# Patient Record
Sex: Female | Born: 1995
Health system: Southern US, Community
[De-identification: ages and names within clinical notes are randomized; demographics above are authoritative.]

## PROBLEM LIST (undated history)

## (undated) ENCOUNTER — Inpatient Hospital Stay (HOSPITAL_COMMUNITY): Payer: Self-pay

## (undated) DIAGNOSIS — F329 Major depressive disorder, single episode, unspecified: Secondary | ICD-10-CM

## (undated) DIAGNOSIS — O210 Mild hyperemesis gravidarum: Secondary | ICD-10-CM

## (undated) DIAGNOSIS — F32A Depression, unspecified: Secondary | ICD-10-CM

## (undated) HISTORY — PX: OTHER SURGICAL HISTORY: SHX169

---
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
## 2017-03-14 ENCOUNTER — Encounter (HOSPITAL_COMMUNITY): Payer: Self-pay | Admitting: *Deleted

## 2017-03-14 ENCOUNTER — Inpatient Hospital Stay (HOSPITAL_COMMUNITY)
Admission: AD | Admit: 2017-03-14 | Discharge: 2017-03-14 | Disposition: A | Discharge status: Home / Self Care | Payer: BLUE CROSS/BLUE SHIELD | Source: Ambulatory Visit | Attending: Obstetrics and Gynecology | Admitting: Obstetrics and Gynecology

## 2017-03-14 DIAGNOSIS — O210 Mild hyperemesis gravidarum: Secondary | ICD-10-CM | POA: Diagnosis present

## 2017-03-14 DIAGNOSIS — R109 Unspecified abdominal pain: Secondary | ICD-10-CM | POA: Insufficient documentation

## 2017-03-14 DIAGNOSIS — Z3A10 10 weeks gestation of pregnancy: Secondary | ICD-10-CM | POA: Insufficient documentation

## 2017-03-14 DIAGNOSIS — O26891 Other specified pregnancy related conditions, first trimester: Secondary | ICD-10-CM | POA: Diagnosis not present

## 2017-03-14 DIAGNOSIS — Z880 Allergy status to penicillin: Secondary | ICD-10-CM | POA: Insufficient documentation

## 2017-03-14 HISTORY — DX: Mild hyperemesis gravidarum: O21.0

## 2017-03-14 LAB — URINALYSIS, ROUTINE W REFLEX MICROSCOPIC
BILIRUBIN URINE: NEGATIVE
Glucose, UA: NEGATIVE mg/dL
HGB URINE DIPSTICK: NEGATIVE
KETONES UR: 20 mg/dL — AB
Leukocytes, UA: NEGATIVE
NITRITE: NEGATIVE
PROTEIN: NEGATIVE mg/dL
SPECIFIC GRAVITY, URINE: 1.019 (ref 1.005–1.030)
pH: 5 (ref 5.0–8.0)

## 2017-03-14 MED ORDER — METOCLOPRAMIDE HCL 10 MG PO TABS: 10.0000 mg | ORAL_TABLET | Freq: Four times a day (QID) | ORAL | 0 refills | Status: DC

## 2017-03-14 MED ORDER — PROMETHAZINE HCL 25 MG/ML IJ SOLN
25.0000 mg | Freq: Once | INTRAVENOUS | Status: AC
  Administered 2017-03-14: 25 mg via INTRAVENOUS
  Filled 2017-03-14: qty 1

## 2017-03-14 MED ORDER — M.V.I. ADULT IV INJ
Freq: Once | INTRAVENOUS | Status: AC
  Administered 2017-03-14: 18:00:00 via INTRAVENOUS
  Filled 2017-03-14: qty 1000

## 2017-03-14 NOTE — MAU Note (Signed)
For the past several days, last 2 days have been worse.  Hasn't been keeping anything down.  Last night ribs were hurting to the point it hurt to breath.  Called office today, told to come here at earliest convenience.  Was prescribed phenergan, not taking it

## 2017-03-14 NOTE — MAU Note (Signed)
PO ICE 

## 2017-03-14 NOTE — MAU Note (Signed)
Report given to Debra, RN.

## 2017-03-14 NOTE — MAU Provider Note (Signed)
History     CSN: [id number]  Arrival date and time: 03/14/17 1412   First Provider Initiated Contact with Patient 03/14/17 1625      Chief Complaint  Patient presents with  . Abdominal Pain  . Emesis   HPI  Christine Osborn is a 21 yo G1P0 at 10.[redacted] wks gestation by LMP presenting to MAU with complaints of vomiting and "not being able to keep anything down for the past several days; with the last 2 days being worse".  She reports that she has vomited so much that her ribs started hurting last night to the point where she could not breathe.  She called the OB office today and was told by a RN to come in for evaluation, because "she probably has put the baby in distress by waiting so long to be seen". She is very concerned that there could be something wrong with her baby.  She has a Rx for Phenergan at home, but hasn't taken it.  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  Past Medical History:  Diagnosis Date  . Morning sickness 03/14/2017    History reviewed. No pertinent surgical history.  History reviewed. No pertinent family history.  Social History  Substance Use Topics  . Smoking status: Never Smoker  . Smokeless tobacco: Never Used  . Alcohol use No    Allergies:  Allergies  Allergen Reactions  . Penicillins Hives    Has patient had a PCN reaction causing immediate rash, facial/tongue/throat swelling, SOB or lightheadedness with hypotension: no Has patient had a PCN reaction causing severe rash involving mucus membranes or skin necrosis: no Has patient had a PCN reaction that required hospitalization: unknown Has patient had a PCN reaction occurring within the last 10 years: no If all of the above answers are "NO", then may proceed with Cephalosporin use.     No prescriptions prior to admission.    Review of Systems  Constitutional: Positive for appetite change.  HENT: Negative.   Eyes: Negative.   Respiratory: Negative.    Cardiovascular: Negative.   Gastrointestinal: Positive for nausea and vomiting.  Endocrine: Negative.   Genitourinary: Negative.   Musculoskeletal: Negative.   Skin: Negative.   Allergic/Immunologic: Negative.   Neurological: Negative.   Hematological: Negative.   Psychiatric/Behavioral: The patient is nervous/anxious ("worried baby is in distress").    Physical Exam   Blood pressure 117/66, pulse 78, temperature 98.4 F (36.9 C), temperature source Oral, resp. rate 16, weight 64.9 kg (143 lb), last menstrual period 12/29/2016, SpO2 100 %.  Physical Exam  Constitutional: She is oriented to person, place, and time. She appears well-developed and well-nourished.  HENT:  Head: Normocephalic.  Eyes: Pupils are equal, round, and reactive to light.  Neck: Normal range of motion.  Cardiovascular: Normal rate, regular rhythm and normal heart sounds.   Respiratory: Effort normal and breath sounds normal.  GI: Soft. Bowel sounds are decreased.  Genitourinary:  Genitourinary Comments: Pelvic deferred / informal BS U/S performed by me -- active fetus with (+) cardiac activity // mvmt and cardiac activity seen by both patient and mother-in-law  Musculoskeletal: Normal range of motion.  Neurological: She is alert and oriented to person, place, and time.  Skin: Skin is warm and dry.  Psychiatric: Her behavior is normal. Judgment and thought content normal. Her mood appears anxious.  Pt was very terful and anxious over the well-being pf her baby; mood is very happy and anxiety alleviated after  informal BS U/S done    MAU Course  Procedures  MDM CCUA IVFs: Liter #1 Phenergan 25 mg in of D5LR at 999 ml/hr Liter #2 MVI 1 ampule in 1000 ml of LR at 500 ml/hr  Results for orders placed or performed during the hospital encounter of 03/14/17 (from the past 24 hour(s))  Urinalysis, Routine w reflex microscopic     Status: Abnormal   Collection Time: 03/14/17  3:06 PM  Result Value Ref  Range   Color, Urine YELLOW YELLOW   APPearance CLOUDY (A) CLEAR   Specific Gravity, Urine 1.019 1.005 - 1.030   pH 5.0 5.0 - 8.0   Glucose, UA NEGATIVE NEGATIVE mg/dL   Hgb urine dipstick NEGATIVE NEGATIVE   Bilirubin Urine NEGATIVE NEGATIVE   Ketones, ur 20 (A) NEGATIVE mg/dL   Protein, ur NEGATIVE NEGATIVE mg/dL   Nitrite NEGATIVE NEGATIVE   Leukocytes, UA NEGATIVE NEGATIVE    Assessment and Plan  Morning sickness - Rx for Reglan 10 mg every 6 hr prn N/V - Advised to use Phenergan 25 mg tablet prn when Reglan not working - Advised to eat small, frequent meals every 2-3 hrs and stay well-hydrated - Keep scheduled appts with [facility]  Discharge home Patient verbalized an understanding of the plan of care and agrees.   Raelyn Mora, MSN, CNM 03/15/2017, 4:38 PM

## 2017-03-14 NOTE — MAU Note (Signed)
PO [facility] 

## 2017-03-23 LAB — OB RESULTS CONSOLE HEPATITIS B SURFACE ANTIGEN: Hepatitis B Surface Ag: NEGATIVE

## 2017-03-23 LAB — OB RESULTS CONSOLE RPR: RPR: NONREACTIVE

## 2017-03-23 LAB — OB RESULTS CONSOLE ABO/RH: RH TYPE: NEGATIVE

## 2017-03-23 LAB — OB RESULTS CONSOLE HIV ANTIBODY (ROUTINE TESTING): HIV: NONREACTIVE

## 2017-03-23 LAB — OB RESULTS CONSOLE ANTIBODY SCREEN: Antibody Screen: NEGATIVE

## 2017-03-23 LAB — OB RESULTS CONSOLE RUBELLA ANTIBODY, IGM: Rubella: NON-IMMUNE/NOT IMMUNE

## 2017-05-17 LAB — OB RESULTS CONSOLE GC/CHLAMYDIA
Chlamydia: NEGATIVE
Gonorrhea: NEGATIVE

## 2017-06-21 ENCOUNTER — Encounter (HOSPITAL_COMMUNITY): Payer: Self-pay | Admitting: *Deleted

## 2017-06-21 ENCOUNTER — Inpatient Hospital Stay (HOSPITAL_COMMUNITY)
Admission: AD | Admit: 2017-06-21 | Discharge: 2017-06-21 | Disposition: A | Discharge status: Home / Self Care | Payer: BLUE CROSS/BLUE SHIELD | Source: Ambulatory Visit | Attending: Obstetrics and Gynecology | Admitting: Obstetrics and Gynecology

## 2017-06-21 DIAGNOSIS — R000 Tachycardia, unspecified: Secondary | ICD-10-CM | POA: Diagnosis not present

## 2017-06-21 DIAGNOSIS — W19XXXA Unspecified fall, initial encounter: Secondary | ICD-10-CM

## 2017-06-21 DIAGNOSIS — Z3A24 24 weeks gestation of pregnancy: Secondary | ICD-10-CM | POA: Insufficient documentation

## 2017-06-21 DIAGNOSIS — O26892 Other specified pregnancy related conditions, second trimester: Secondary | ICD-10-CM | POA: Insufficient documentation

## 2017-06-21 DIAGNOSIS — W000XXA Fall on same level due to ice and snow, initial encounter: Secondary | ICD-10-CM | POA: Diagnosis not present

## 2017-06-21 DIAGNOSIS — Z880 Allergy status to penicillin: Secondary | ICD-10-CM | POA: Diagnosis not present

## 2017-06-21 DIAGNOSIS — R109 Unspecified abdominal pain: Secondary | ICD-10-CM | POA: Diagnosis not present

## 2017-06-21 DIAGNOSIS — O210 Mild hyperemesis gravidarum: Secondary | ICD-10-CM | POA: Diagnosis not present

## 2017-06-21 DIAGNOSIS — R509 Fever, unspecified: Secondary | ICD-10-CM | POA: Insufficient documentation

## 2017-06-21 DIAGNOSIS — O368120 Decreased fetal movements, second trimester, not applicable or unspecified: Secondary | ICD-10-CM | POA: Diagnosis not present

## 2017-06-21 DIAGNOSIS — R1084 Generalized abdominal pain: Secondary | ICD-10-CM | POA: Insufficient documentation

## 2017-06-21 LAB — URINALYSIS, ROUTINE W REFLEX MICROSCOPIC
BILIRUBIN URINE: NEGATIVE
GLUCOSE, UA: NEGATIVE mg/dL
Hgb urine dipstick: NEGATIVE
KETONES UR: NEGATIVE mg/dL
Leukocytes, UA: NEGATIVE
Nitrite: NEGATIVE
PH: 5 (ref 5.0–8.0)
Protein, ur: NEGATIVE mg/dL
Specific Gravity, Urine: 1.013 (ref 1.005–1.030)

## 2017-06-21 LAB — CBC WITH DIFFERENTIAL/PLATELET
BASOS PCT: 0 %
Basophils Absolute: 0 10*3/uL (ref 0.0–0.1)
EOS ABS: 0 10*3/uL (ref 0.0–0.7)
EOS PCT: 0 %
HCT: 31.8 % — ABNORMAL LOW (ref 36.0–46.0)
Hemoglobin: 10.9 g/dL — ABNORMAL LOW (ref 12.0–15.0)
Lymphocytes Relative: 14 %
Lymphs Abs: 1.4 10*3/uL (ref 0.7–4.0)
MCH: 32.2 pg (ref 26.0–34.0)
MCHC: 34.3 g/dL (ref 30.0–36.0)
MCV: 93.8 fL (ref 78.0–100.0)
MONO ABS: 0.4 10*3/uL (ref 0.1–1.0)
MONOS PCT: 4 %
Neutro Abs: 8.1 10*3/uL (ref 1.7–7.7)
Neutrophils Relative %: 82 %
Platelets: 158 10*3/uL (ref 150–400)
RBC: 3.39 MIL/uL — ABNORMAL LOW (ref 3.87–5.11)
RDW: 12.3 % (ref 11.5–15.5)
WBC: 9.9 10*3/uL (ref 4.0–10.5)

## 2017-06-21 MED ORDER — IBUPROFEN 600 MG PO TABS
600.0000 mg | ORAL_TABLET | Freq: Once | ORAL | Status: AC
  Administered 2017-06-21: 600 mg via ORAL
  Filled 2017-06-21: qty 1

## 2017-06-21 NOTE — MAU Note (Signed)
Pt states she feels her belly tightening sometimes at the top and some lower down started around 0030 last night.  States they have been getting closer together.  Some occasional sharp pains on left side down low and at the top of her abdomen.  Denies LOF/VB.

## 2017-06-21 NOTE — MAU Provider Note (Signed)
History     CSN: [id number]  Arrival date and time: 06/21/17 1506   First Provider Initiated Contact with Patient 06/21/17 1635      Chief Complaint  Patient presents with  . Contractions   HPI   Ms.Christine Osborn is a 21 y.o. female G1P0 @ 5113w6d who presents with generalized abdominal pain. States the pain started last night around midnight; the pain woke her up. The pain comes and goes. No bleeding or leaking of water. States she has never had this pain before. States she fell yesterday while going into work. She slipped on Ice and fell back. States she did not fall back directly onto her butt, and did not hit her abdomen. She did not hit her head or lose consciousness.  + fetal movement currently, however had decreased fetal movement today.   OB History    Gravida Para Term Preterm AB Living   1             SAB TAB Ectopic Multiple Live Births                  Past Medical History:  Diagnosis Date  . Morning sickness 03/14/2017    History reviewed. No pertinent surgical history.  History reviewed. No pertinent family history.  Social History   Tobacco Use  . Smoking status: Never Smoker  . Smokeless tobacco: Never Used  Substance Use Topics  . Alcohol use: No  . Drug use: No    Allergies:  Allergies  Allergen Reactions  . Penicillins Hives    Has patient had a PCN reaction causing immediate rash, facial/tongue/throat swelling, SOB or lightheadedness with hypotension: no Has patient had a PCN reaction causing severe rash involving mucus membranes or skin necrosis: no Has patient had a PCN reaction that required hospitalization: unknown Has patient had a PCN reaction occurring within the last 10 years: no If all of the above answers are "NO", then may proceed with Cephalosporin use.     Medications Prior to Admission  Medication Sig Dispense Refill Last Dose  . metoCLOPramide (REGLAN) 10 MG tablet Take 1 tablet (10 mg total) by mouth every 6 (six) hours. 30  tablet 0   . promethazine (PHENERGAN) 25 MG tablet Take 25 mg by mouth every 6 (six) hours as needed for nausea.   1 Not Taking at Unknown time   Results for orders placed or performed during the hospital encounter of 06/21/17 (from the past 48 hour(s))  CBC with Differential     Status: Abnormal   Collection Time: 06/21/17  4:59 PM  Result Value Ref Range   WBC 9.9 4.0 - 10.5 K/uL    Comment: REPEATED TO VERIFY   RBC 3.39 (L) 3.87 - 5.11 MIL/uL   Hemoglobin 10.9 (L) 12.0 - 15.0 g/dL   HCT 29.531.8 (L) 28.436.0 - 13.246.0 %   MCV 93.8 78.0 - 100.0 fL   MCH 32.2 26.0 - 34.0 pg   MCHC 34.3 30.0 - 36.0 g/dL   RDW 44.012.3 10.211.5 - 72.515.5 %   Platelets 158 150 - 400 K/uL    Comment: REPEATED TO VERIFY SPECIMEN CHECKED FOR CLOTS PLATELET COUNT CONFIRMED BY SMEAR    Neutrophils Relative % 82 %   Neutro Abs 8.1 1.7 - 7.7 K/uL   Lymphocytes Relative 14 %   Lymphs Abs 1.4 0.7 - 4.0 K/uL   Monocytes Relative 4 %   Monocytes Absolute 0.4 0.1 - 1.0 K/uL   Eosinophils Relative 0 %  Eosinophils Absolute 0.0 0.0 - 0.7 K/uL   Basophils Relative 0 %   Basophils Absolute 0.0 0.0 - 0.1 K/uL  Urinalysis, Routine w reflex microscopic     Status: None   Collection Time: 06/21/17  5:06 PM  Result Value Ref Range   Color, Urine YELLOW YELLOW   APPearance CLEAR CLEAR   Specific Gravity, Urine 1.013 1.005 - 1.030   pH 5.0 5.0 - 8.0   Glucose, UA NEGATIVE NEGATIVE mg/dL   Hgb urine dipstick NEGATIVE NEGATIVE   Bilirubin Urine NEGATIVE NEGATIVE   Ketones, ur NEGATIVE NEGATIVE mg/dL   Protein, ur NEGATIVE NEGATIVE mg/dL   Nitrite NEGATIVE NEGATIVE   Leukocytes, UA NEGATIVE NEGATIVE    Review of Systems  Constitutional: Positive for fever (99.2).  Gastrointestinal: Negative for nausea and vomiting.  Genitourinary: Negative for dysuria and flank pain.   Physical Exam   Blood pressure 111/61, pulse 95, temperature 99.2 F (37.3 C), temperature source Oral, resp. rate 16, last menstrual period 12/29/2016, SpO2 99  %.   Patient Vitals for the past 24 hrs:  BP Temp Temp src Pulse Resp SpO2  06/21/17 1846 117/75 - - - - -  06/21/17 1831 111/61 99.2 F (37.3 C) Oral 95 16 -  06/21/17 1650 - (!) 100.5 F (38.1 C) Oral - - -  06/21/17 1524 129/67 - - (!) 123 17 99 %    Physical Exam  Constitutional: She is oriented to person, place, and time. She appears well-developed and well-nourished. No distress.  HENT:  Head: Normocephalic.  Eyes: Pupils are equal, round, and reactive to light.  Cardiovascular: Normal rate.  Respiratory: Effort normal and breath sounds normal.  GI: Soft. She exhibits no distension and no mass. There is no tenderness. There is no rebound, no guarding and no CVA tenderness.  Genitourinary:  Genitourinary Comments: Dilation: Closed Effacement (%): Thick Cervical Position: Posterior Exam by:: Lanelle Lindo, NP  Musculoskeletal: Normal range of motion.  Neurological: She is alert and oriented to person, place, and time.  Skin: Skin is warm. She is not diaphoretic.  Psychiatric: Her behavior is normal.   Fetal Tracing: Baseline: 150 bpm Variability: Moderate  Accelerations: 15x15 Decelerations: None Toco: None  MAU Course  Procedures  None  MDM  UA CBC with diff Patient mildly febrile on admission with tachycardia. Both resolved with ibuprofen 600 mg PO.  No rebound, or N/V, WBC within normal limits. Discussed patient with Dr. Claiborne Billingsallahan   Assessment and Plan   A:  1. Fall, initial encounter   2. Morning sickness   3. Abdominal pain during pregnancy in second trimester   4. Tachycardia   5. Fever, unknown origin     Discharge home with strict return precautions Follow up with OB as scheduled, or sooner if needed Kick counts discussed with patient; inconsistent movements at this gestation is normal Return to MAU if symptoms worsen Fall precautions at home  Orena Cavazos, Harolyn RutherfordJennifer I, NP 06/22/2017 9:57 AM

## 2017-06-21 NOTE — MAU Note (Signed)
Urine in lab 

## 2017-06-21 NOTE — Discharge Instructions (Signed)

## 2017-09-15 ENCOUNTER — Inpatient Hospital Stay (HOSPITAL_COMMUNITY): Payer: BLUE CROSS/BLUE SHIELD | Admitting: Anesthesiology

## 2017-09-15 ENCOUNTER — Encounter (HOSPITAL_COMMUNITY)
Admission: AD | Disposition: A | Discharge status: Home / Self Care | Payer: Self-pay | Source: Ambulatory Visit | Attending: Obstetrics and Gynecology

## 2017-09-15 ENCOUNTER — Encounter (HOSPITAL_COMMUNITY): Payer: Self-pay

## 2017-09-15 ENCOUNTER — Inpatient Hospital Stay (HOSPITAL_COMMUNITY)
Admission: AD | Admit: 2017-09-15 | Discharge: 2017-09-17 | DRG: 787 | Disposition: A | Discharge status: Home / Self Care | Payer: BLUE CROSS/BLUE SHIELD | Source: Ambulatory Visit | Attending: Obstetrics and Gynecology | Admitting: Obstetrics and Gynecology

## 2017-09-15 DIAGNOSIS — O321XX0 Maternal care for breech presentation, not applicable or unspecified: Secondary | ICD-10-CM | POA: Diagnosis present

## 2017-09-15 DIAGNOSIS — Z3A37 37 weeks gestation of pregnancy: Secondary | ICD-10-CM

## 2017-09-15 DIAGNOSIS — O9902 Anemia complicating childbirth: Secondary | ICD-10-CM | POA: Diagnosis present

## 2017-09-15 DIAGNOSIS — D649 Anemia, unspecified: Secondary | ICD-10-CM | POA: Diagnosis present

## 2017-09-15 DIAGNOSIS — O4103X0 Oligohydramnios, third trimester, not applicable or unspecified: Secondary | ICD-10-CM | POA: Diagnosis present

## 2017-09-15 DIAGNOSIS — Z9889 Other specified postprocedural states: Secondary | ICD-10-CM

## 2017-09-15 LAB — AMNISURE RUPTURE OF MEMBRANE (ROM) NOT AT ARMC: Amnisure ROM: NEGATIVE

## 2017-09-15 LAB — CBC
HCT: 28.3 % — ABNORMAL LOW (ref 36.0–46.0)
Hemoglobin: 9.5 g/dL — ABNORMAL LOW (ref 12.0–15.0)
MCH: 27.2 pg (ref 26.0–34.0)
MCHC: 33.6 g/dL (ref 30.0–36.0)
MCV: 81.1 fL (ref 78.0–100.0)
PLATELETS: 168 10*3/uL (ref 150–400)
RBC: 3.49 MIL/uL — ABNORMAL LOW (ref 3.87–5.11)
RDW: 14.5 % (ref 11.5–15.5)
WBC: 9.3 10*3/uL (ref 4.0–10.5)

## 2017-09-15 SURGERY — Surgical Case
Anesthesia: Spinal | Wound class: Clean Contaminated

## 2017-09-15 MED ORDER — OXYCODONE-ACETAMINOPHEN 5-325 MG PO TABS
1.0000 | ORAL_TABLET | ORAL | Status: DC | PRN
  Administered 2017-09-16 (×2): 1 via ORAL
  Filled 2017-09-15 (×2): qty 1

## 2017-09-15 MED ORDER — SOD CITRATE-CITRIC ACID 500-334 MG/5ML PO SOLN
30.0000 mL | Freq: Once | ORAL | Status: AC
  Administered 2017-09-15: 30 mL via ORAL
  Filled 2017-09-15: qty 15

## 2017-09-15 MED ORDER — BISACODYL 10 MG RE SUPP: 10.0000 mg | Freq: Every day | RECTAL | Status: DC | PRN

## 2017-09-15 MED ORDER — ZOLPIDEM TARTRATE 5 MG PO TABS: 5.0000 mg | ORAL_TABLET | Freq: Every evening | ORAL | Status: DC | PRN

## 2017-09-15 MED ORDER — DIPHENHYDRAMINE HCL 50 MG/ML IJ SOLN: 12.5000 mg | INTRAMUSCULAR | Status: DC | PRN

## 2017-09-15 MED ORDER — SIMETHICONE 80 MG PO CHEW
80.0000 mg | CHEWABLE_TABLET | ORAL | Status: DC
  Administered 2017-09-15 – 2017-09-16 (×2): 80 mg via ORAL
  Filled 2017-09-15 (×2): qty 1

## 2017-09-15 MED ORDER — OXYTOCIN 10 UNIT/ML IJ SOLN
INTRAMUSCULAR | Status: AC
  Filled 2017-09-15: qty 4

## 2017-09-15 MED ORDER — MEPERIDINE HCL 25 MG/ML IJ SOLN: 6.2500 mg | INTRAMUSCULAR | Status: DC | PRN

## 2017-09-15 MED ORDER — PHENYLEPHRINE 40 MCG/ML (10ML) SYRINGE FOR IV PUSH (FOR BLOOD PRESSURE SUPPORT)
PREFILLED_SYRINGE | INTRAVENOUS | Status: AC
  Filled 2017-09-15: qty 20

## 2017-09-15 MED ORDER — ONDANSETRON HCL 4 MG/2ML IJ SOLN
INTRAMUSCULAR | Status: AC
  Filled 2017-09-15: qty 2

## 2017-09-15 MED ORDER — LACTATED RINGERS IV SOLN
INTRAVENOUS | Status: DC | PRN
  Administered 2017-09-15: 17:00:00 via INTRAVENOUS

## 2017-09-15 MED ORDER — PHENYLEPHRINE HCL 10 MG/ML IJ SOLN
INTRAMUSCULAR | Status: DC | PRN
  Administered 2017-09-15 (×3): 40 ug via INTRAVENOUS
  Administered 2017-09-15: 80 ug via INTRAVENOUS

## 2017-09-15 MED ORDER — SCOPOLAMINE 1 MG/3DAYS TD PT72
MEDICATED_PATCH | TRANSDERMAL | Status: DC | PRN
  Administered 2017-09-15: 1 via TRANSDERMAL

## 2017-09-15 MED ORDER — OXYTOCIN 40 UNITS IN LACTATED RINGERS INFUSION - SIMPLE MED: 2.5000 [IU]/h | INTRAVENOUS | Status: AC

## 2017-09-15 MED ORDER — MORPHINE SULFATE (PF) 0.5 MG/ML IJ SOLN
INTRAMUSCULAR | Status: AC
  Filled 2017-09-15: qty 10

## 2017-09-15 MED ORDER — PHENYLEPHRINE 8 MG IN D5W 100 ML (0.08MG/ML) PREMIX OPTIME
INJECTION | INTRAVENOUS | Status: DC | PRN
  Administered 2017-09-15: 60 ug/min via INTRAVENOUS

## 2017-09-15 MED ORDER — TETANUS-DIPHTH-ACELL PERTUSSIS 5-2.5-18.5 LF-MCG/0.5 IM SUSP: 0.5000 mL | Freq: Once | INTRAMUSCULAR | Status: DC

## 2017-09-15 MED ORDER — PRENATAL MULTIVITAMIN CH
1.0000 | ORAL_TABLET | Freq: Every day | ORAL | Status: DC
  Administered 2017-09-16 – 2017-09-17 (×2): 1 via ORAL
  Filled 2017-09-15 (×2): qty 1

## 2017-09-15 MED ORDER — SENNOSIDES-DOCUSATE SODIUM 8.6-50 MG PO TABS
2.0000 | ORAL_TABLET | ORAL | Status: DC
  Administered 2017-09-15 – 2017-09-16 (×2): 2 via ORAL
  Filled 2017-09-15 (×2): qty 2

## 2017-09-15 MED ORDER — MEASLES, MUMPS & RUBELLA VAC ~~LOC~~ INJ: 0.5000 mL | INJECTION | Freq: Once | SUBCUTANEOUS | Status: DC

## 2017-09-15 MED ORDER — METHYLERGONOVINE MALEATE 0.2 MG PO TABS: 0.2000 mg | ORAL_TABLET | ORAL | Status: DC | PRN

## 2017-09-15 MED ORDER — FLEET ENEMA 7-19 GM/118ML RE ENEM: 1.0000 | ENEMA | Freq: Every day | RECTAL | Status: DC | PRN

## 2017-09-15 MED ORDER — MENTHOL 3 MG MT LOZG: 1.0000 | LOZENGE | OROMUCOSAL | Status: DC | PRN

## 2017-09-15 MED ORDER — FERROUS SULFATE 325 (65 FE) MG PO TABS
325.0000 mg | ORAL_TABLET | Freq: Two times a day (BID) | ORAL | Status: DC
  Administered 2017-09-16 – 2017-09-17 (×3): 325 mg via ORAL
  Filled 2017-09-15 (×3): qty 1

## 2017-09-15 MED ORDER — OXYCODONE-ACETAMINOPHEN 5-325 MG PO TABS
2.0000 | ORAL_TABLET | ORAL | Status: DC | PRN
  Administered 2017-09-17 (×2): 2 via ORAL
  Filled 2017-09-15 (×2): qty 2

## 2017-09-15 MED ORDER — LACTATED RINGERS IV BOLUS (SEPSIS): 1000.0000 mL | Freq: Once | INTRAVENOUS | Status: DC

## 2017-09-15 MED ORDER — NALBUPHINE HCL 10 MG/ML IJ SOLN: 5.0000 mg | INTRAMUSCULAR | Status: DC | PRN

## 2017-09-15 MED ORDER — DIPHENHYDRAMINE HCL 25 MG PO CAPS: 25.0000 mg | ORAL_CAPSULE | Freq: Four times a day (QID) | ORAL | Status: DC | PRN

## 2017-09-15 MED ORDER — CLINDAMYCIN PHOSPHATE 900 MG/50ML IV SOLN
900.0000 mg | INTRAVENOUS | Status: AC
  Administered 2017-09-15: 900 mg via INTRAVENOUS
  Filled 2017-09-15: qty 50

## 2017-09-15 MED ORDER — PHENYLEPHRINE 8 MG IN D5W 100 ML (0.08MG/ML) PREMIX OPTIME
INJECTION | INTRAVENOUS | Status: AC
  Filled 2017-09-15: qty 100

## 2017-09-15 MED ORDER — IBUPROFEN 800 MG PO TABS
800.0000 mg | ORAL_TABLET | Freq: Three times a day (TID) | ORAL | Status: DC
  Administered 2017-09-16 – 2017-09-17 (×5): 800 mg via ORAL
  Filled 2017-09-15 (×5): qty 1

## 2017-09-15 MED ORDER — ACETAMINOPHEN 325 MG PO TABS
650.0000 mg | ORAL_TABLET | ORAL | Status: DC | PRN
  Administered 2017-09-15: 650 mg via ORAL
  Filled 2017-09-15: qty 2

## 2017-09-15 MED ORDER — GENTAMICIN SULFATE 40 MG/ML IJ SOLN
5.0000 mg/kg | INTRAVENOUS | Status: AC
  Administered 2017-09-15: 290 mg via INTRAVENOUS
  Filled 2017-09-15: qty 7.25

## 2017-09-15 MED ORDER — FAMOTIDINE IN NACL 20-0.9 MG/50ML-% IV SOLN
20.0000 mg | Freq: Once | INTRAVENOUS | Status: AC
  Administered 2017-09-15: 20 mg via INTRAVENOUS
  Filled 2017-09-15: qty 50

## 2017-09-15 MED ORDER — ONDANSETRON HCL 4 MG/2ML IJ SOLN
INTRAMUSCULAR | Status: DC | PRN
  Administered 2017-09-15: 4 mg via INTRAVENOUS

## 2017-09-15 MED ORDER — MORPHINE SULFATE (PF) 0.5 MG/ML IJ SOLN
INTRAMUSCULAR | Status: DC | PRN
  Administered 2017-09-15: .1 mg via INTRATHECAL

## 2017-09-15 MED ORDER — SIMETHICONE 80 MG PO CHEW: 80.0000 mg | CHEWABLE_TABLET | ORAL | Status: DC | PRN

## 2017-09-15 MED ORDER — LACTATED RINGERS IV SOLN
INTRAVENOUS | Status: DC
  Administered 2017-09-15 (×2): via INTRAVENOUS

## 2017-09-15 MED ORDER — FENTANYL CITRATE (PF) 100 MCG/2ML IJ SOLN
INTRAMUSCULAR | Status: AC
  Filled 2017-09-15: qty 2

## 2017-09-15 MED ORDER — COCONUT OIL OIL: 1.0000 "application " | TOPICAL_OIL | Status: DC | PRN

## 2017-09-15 MED ORDER — OXYTOCIN 10 UNIT/ML IJ SOLN
INTRAVENOUS | Status: DC | PRN
  Administered 2017-09-15: 40 [IU] via INTRAVENOUS

## 2017-09-15 MED ORDER — KETOROLAC TROMETHAMINE 30 MG/ML IJ SOLN
30.0000 mg | Freq: Once | INTRAMUSCULAR | Status: AC
  Administered 2017-09-15: 30 mg via INTRAVENOUS
  Filled 2017-09-15: qty 1

## 2017-09-15 MED ORDER — DIPHENHYDRAMINE HCL 25 MG PO CAPS: 25.0000 mg | ORAL_CAPSULE | ORAL | Status: DC | PRN

## 2017-09-15 MED ORDER — SCOPOLAMINE 1 MG/3DAYS TD PT72
MEDICATED_PATCH | TRANSDERMAL | Status: AC
  Filled 2017-09-15: qty 1

## 2017-09-15 MED ORDER — NALBUPHINE HCL 10 MG/ML IJ SOLN: 5.0000 mg | Freq: Once | INTRAMUSCULAR | Status: DC | PRN

## 2017-09-15 MED ORDER — CLINDAMYCIN PHOSPHATE 900 MG/50ML IV SOLN: 900.0000 mg | Freq: Once | INTRAVENOUS | Status: DC

## 2017-09-15 MED ORDER — METHYLERGONOVINE MALEATE 0.2 MG/ML IJ SOLN: 0.2000 mg | INTRAMUSCULAR | Status: DC | PRN

## 2017-09-15 MED ORDER — LACTATED RINGERS IV SOLN
INTRAVENOUS | Status: DC
  Administered 2017-09-15: 125 mL/h via INTRAVENOUS

## 2017-09-15 MED ORDER — SIMETHICONE 80 MG PO CHEW
80.0000 mg | CHEWABLE_TABLET | Freq: Three times a day (TID) | ORAL | Status: DC
  Administered 2017-09-16 – 2017-09-17 (×4): 80 mg via ORAL
  Filled 2017-09-15 (×4): qty 1

## 2017-09-15 MED ORDER — FENTANYL CITRATE (PF) 100 MCG/2ML IJ SOLN: 25.0000 ug | INTRAMUSCULAR | Status: DC | PRN

## 2017-09-15 MED ORDER — SCOPOLAMINE 1 MG/3DAYS TD PT72: 1.0000 | MEDICATED_PATCH | Freq: Once | TRANSDERMAL | Status: DC

## 2017-09-15 MED ORDER — NALOXONE HCL 4 MG/10ML IJ SOLN: 1.0000 ug/kg/h | INTRAVENOUS | Status: DC | PRN

## 2017-09-15 MED ORDER — FENTANYL CITRATE (PF) 100 MCG/2ML IJ SOLN
INTRAMUSCULAR | Status: DC | PRN
  Administered 2017-09-15: 25 ug via INTRATHECAL

## 2017-09-15 MED ORDER — LACTATED RINGERS IV SOLN
INTRAVENOUS | Status: DC
  Administered 2017-09-15: 13:00:00 via INTRAVENOUS

## 2017-09-15 SURGICAL SUPPLY — 30 items
BENZOIN TINCTURE PRP APPL 2/3 (GAUZE/BANDAGES/DRESSINGS) ×2 IMPLANT
CHLORAPREP W/TINT 26ML (MISCELLANEOUS) ×2 IMPLANT
CLAMP CORD UMBIL (MISCELLANEOUS) IMPLANT
CLOTH BEACON ORANGE TIMEOUT ST (SAFETY) ×2 IMPLANT
DRSG OPSITE POSTOP 4X10 (GAUZE/BANDAGES/DRESSINGS) ×2 IMPLANT
ELECT REM PT RETURN 9FT ADLT (ELECTROSURGICAL) ×2
ELECTRODE REM PT RTRN 9FT ADLT (ELECTROSURGICAL) ×1 IMPLANT
EXTRACTOR VACUUM BELL STYLE (SUCTIONS) IMPLANT
GLOVE BIO SURGEON STRL SZ7 (GLOVE) ×2 IMPLANT
GLOVE BIOGEL PI IND STRL 7.0 (GLOVE) ×1 IMPLANT
GLOVE BIOGEL PI INDICATOR 7.0 (GLOVE) ×1
GOWN STRL REUS W/TWL LRG LVL3 (GOWN DISPOSABLE) ×4 IMPLANT
KIT ABG SYR 3ML LUER SLIP (SYRINGE) IMPLANT
NEEDLE HYPO 25X5/8 SAFETYGLIDE (NEEDLE) IMPLANT
NS IRRIG 1000ML POUR BTL (IV SOLUTION) ×2 IMPLANT
PACK C SECTION WH (CUSTOM PROCEDURE TRAY) ×2 IMPLANT
PAD OB MATERNITY 4.3X12.25 (PERSONAL CARE ITEMS) ×2 IMPLANT
PENCIL SMOKE EVAC W/HOLSTER (ELECTROSURGICAL) ×2 IMPLANT
RTRCTR C-SECT PINK 25CM LRG (MISCELLANEOUS) ×2 IMPLANT
STRIP CLOSURE SKIN 1/2X4 (GAUZE/BANDAGES/DRESSINGS) ×2 IMPLANT
SUT MNCRL 0 VIOLET CTX 36 (SUTURE) ×2 IMPLANT
SUT MONOCRYL 0 CTX 36 (SUTURE) ×2
SUT PLAIN 2 0 XLH (SUTURE) ×2 IMPLANT
SUT VIC AB 0 CT1 27 (SUTURE) ×2
SUT VIC AB 0 CT1 27XBRD ANBCTR (SUTURE) ×2 IMPLANT
SUT VIC AB 2-0 CT1 27 (SUTURE) ×1
SUT VIC AB 2-0 CT1 TAPERPNT 27 (SUTURE) ×1 IMPLANT
SUT VIC AB 4-0 KS 27 (SUTURE) ×2 IMPLANT
TOWEL OR 17X24 6PK STRL BLUE (TOWEL DISPOSABLE) ×2 IMPLANT
TRAY FOLEY BAG SILVER LF 14FR (SET/KITS/TRAYS/PACK) ×2 IMPLANT

## 2017-09-15 NOTE — Transfer of Care (Signed)
Immediate Anesthesia Transfer of Care Note  Patient: Christine Osborn  Procedure(s) Performed: CESAREAN SECTION (N/A )  Patient Location: PACU  Anesthesia Type:Spinal  Level of Consciousness: awake, alert  and oriented  Airway & Oxygen Therapy: Patient Spontanous Breathing  Post-op Assessment: Report given to RN and Post -op Vital signs reviewed and stable  Post vital signs: Reviewed and stable  Last Vitals:  Vitals:   09/15/17 1010 09/15/17 1732  BP: 138/74 (!) 109/51  Pulse: (!) 108 70  Resp:  12  Temp:  36.6 C  SpO2:  99%    Last Pain:  Vitals:   09/15/17 1732  TempSrc: Oral  PainSc:       Patients Stated Pain Goal: 2 (09/15/17 1008)  Complications: No apparent anesthesia complications

## 2017-09-15 NOTE — MAU Note (Signed)
Notified Dr. Geraldo PitterHorvath amnisure results are negative.  MD to consult with Dr. Chestine Sporelark and call back with plan.

## 2017-09-15 NOTE — MAU Note (Signed)
Notified OR Circulator, Anesthesia Dr. Jean RosenthalJackson, per Dr. Jean RosenthalJackson will be around 4:30 for C/S, notified Dr. Henderson CloudHorvath.

## 2017-09-15 NOTE — Op Note (Signed)
09/15/2017  5:19 PM  PATIENT:  Christine Osborn  22 y.o. female  PRE-OPERATIVE DIAGNOSIS:  Primary C/S for Breech  POST-OPERATIVE DIAGNOSIS:  * No post-op diagnosis entered *  PROCEDURE:  Procedure(s): CESAREAN SECTION (N/A)  SURGEON:  Surgeon(s) and Role:    * Carrington ClampHorvath, Dekisha Mesmer, MD - Primary  PHYSICIAN ASSISTANT:   ASSISTANTS: none   ANESTHESIA:   spinal  EBL:  931 mL   SPECIMEN:  No Specimen  DISPOSITION OF SPECIMEN:  PATHOLOGY  COUNTS:  YES  TOURNIQUET:  * No tourniquets in log *  DICTATION: .Note written in EPIC  PLAN OF CARE: Admit to inpatient   PATIENT DISPOSITION:  PACU - hemodynamically stable.   Delay start of Pharmacological VTE agent (>24hrs) due to surgical blood loss or risk of bleeding: not applicable   Complications:  none Medications:  Ancef, Pitocin Findings:  Baby female, Apgars 9,9, weight P.   Normal tubes, ovaries and uterus seen.  Baby was skin to skin with mother after birth in the OR.  Technique:  After adequate spinal anesthesia was achieved, the patient was prepped and draped in usual sterile fashion.  A foley catheter was used to drain the bladder.  A pfannanstiel incision was made with the scalpel and carried down to the fascia with the bovie cautery. The fascia was incised in the midline with the scalpel and carried in a transverse curvilinear manner bilaterally.  The fascia was reflected superiorly and inferiorly off the rectus muscles and the muscles split in the midline.  A bowel free portion of the peritoneum was entered bluntly and then extended in a superior and inferior manner with good visualization of the bowel and bladder.  The Alexis instrument was then placed and the vesico-uterine fascia tented up and incised in a transverse curvilinear manner.  A 2 cm transverse incision was made in the upper portion of the lower uterine segment until the amnion was exposed.   The incision was extended transversely in a blunt manner.  Clear fluid was  noted and the baby delivered in the breech presentation without complication.  The baby was bulb suctioned and the cord was clamped and cut aftet stripping blood from cord into baby.  The baby was then handed to awaiting Neonatology.  The placenta was then delivered manually and the uterus cleared of all debris.  The uterine incision was then closed with a running lock stitch of 0 monocryl.  An imbricating layer of 0 monocryl was closed as well. Excellent hemostasis of the uterine incision was achieved and the abdomen was cleared with irrigation.  The peritoneum was closed with a running stitch of 2-0 vicryl.  This incorporated the rectus muscles as a separate layer.  The fascia was then closed with a running stitch of 0 vicryl.  The subcutaneous layer was closed with interrupted  stitches of 2-0 plain gut.  The skin was closed with 4-0 vicryl on a Keith needle and steri-strips.  The patient tolerated the procedure well and was returned to the recovery room in stable condition.  All counts were correct times three.  Christine Osborn A

## 2017-09-15 NOTE — Anesthesia Procedure Notes (Signed)
Spinal  Patient location during procedure: OR Staffing Anesthesiologist: Cristela BlueJackson, Kasumi Ditullio, MD Spinal Block Patient position: sitting Prep: DuraPrep Patient monitoring: heart rate, blood pressure and continuous pulse ox Approach: right paramedian Location: L3-4 Injection technique: single-shot Needle Needle type: Sprotte  Needle gauge: 24 G Needle length: 9 cm Assessment Sensory level: T3 Additional Notes Spinal Dosage in OR  .75% Bupivicaine ml       1.2     [id number]   mcg        100    Fentanyl mcg            25

## 2017-09-15 NOTE — Brief Op Note (Signed)
09/15/2017  5:19 PM  PATIENT:  Christine Osborn  22 y.o. female  PRE-OPERATIVE DIAGNOSIS:  Primary C/S for Breech  POST-OPERATIVE DIAGNOSIS:  * No post-op diagnosis entered *  PROCEDURE:  Procedure(s): CESAREAN SECTION (N/A)  SURGEON:  Surgeon(s) and Role:    * Carrington ClampHorvath, Yvonda Fouty, MD - Primary  PHYSICIAN ASSISTANT:   ASSISTANTS: none   ANESTHESIA:   spinal  EBL:  931 mL   SPECIMEN:  No Specimen  DISPOSITION OF SPECIMEN:  PATHOLOGY  COUNTS:  YES  TOURNIQUET:  * No tourniquets in log *  DICTATION: .Note written in EPIC  PLAN OF CARE: Admit to inpatient   PATIENT DISPOSITION:  PACU - hemodynamically stable.   Delay start of Pharmacological VTE agent (>24hrs) due to surgical blood loss or risk of bleeding: not applicable

## 2017-09-15 NOTE — Anesthesia Preprocedure Evaluation (Signed)
Anesthesia Evaluation  Patient identified by MRN, date of birth, ID band Patient awake    Reviewed: Allergy & Precautions, H&P , Patient's Chart, lab work & pertinent test results  Airway Mallampati: II  TM Distance: >3 FB Neck ROM: full    Dental no notable dental hx.    Pulmonary    Pulmonary exam normal breath sounds clear to auscultation       Cardiovascular Exercise Tolerance: Good  Rhythm:regular Rate:Normal     Neuro/Psych    GI/Hepatic   Endo/Other    Renal/GU      Musculoskeletal   Abdominal   Peds  Hematology  (+) anemia ,   Anesthesia Other Findings   Reproductive/Obstetrics                             Anesthesia Physical Anesthesia Plan  ASA: II and emergent  Anesthesia Plan: Spinal   Post-op Pain Management:    Induction:   PONV Risk Score and Plan:   Airway Management Planned:   Additional Equipment:   Intra-op Plan:   Post-operative Plan:   Informed Consent: I have reviewed the patients History and Physical, chart, labs and discussed the procedure including the risks, benefits and alternatives for the proposed anesthesia with the patient or authorized representative who has indicated his/her understanding and acceptance.     Plan Discussed with:   Anesthesia Plan Comments: (  )        Anesthesia Quick Evaluation

## 2017-09-15 NOTE — H&P (Signed)
22 y.o.  G1P0 165w1d comes in for routine PNC today and found to have oligo with US to confirm breech presentation.  Patient has good fetal movement and no bleeding.   Past Medical History:  Diagnosis Date  . Morning sickness 03/14/2017   History reviewed. No pertinent surgical history.  OB History  Gravida Para Term Preterm AB Living  1            SAB TAB Ectopic Multiple Live Births               # Outcome Date GA Lbr Len/2nd Weight Sex Delivery Anes PTL Lv  1 Current               Social History   Socioeconomic History  . Marital status: Married    Spouse name: Not on file  . Number of children: Not on file  . Years of education: Not on file  . Highest education level: Not on file  Social Needs  . Financial resource strain: Not on file  . Food insecurity - worry: Not on file  . Food insecurity - inability: Not on file  . Transportation needs - medical: Not on file  . Transportation needs - non-medical: Not on file  Occupational History  . Not on file  Tobacco Use  . Smoking status: Never Smoker  . Smokeless tobacco: Never Used  Substance and Sexual Activity  . Alcohol use: No  . Drug use: No  . Sexual activity: Yes  Other Topics Concern  . Not on file  Social History Narrative  . Not on file   Penicillins   Prenatal Course: uncomplicated.  Pt t r/o for rupture today but did feel like she was leaking recently.   Prenatal Transfer Tool  Maternal Diabetes: No Genetic Screening: Normal Maternal Ultrasounds/Referrals: Normal Fetal Ultrasounds or other Referrals:  None Maternal Substance Abuse:  No Significant Maternal Medications:  None Significant Maternal Lab Results: None  Vitals:   09/15/17 1004 09/15/17 1009 09/15/17 1010  BP:   138/74  Pulse:   (!) 108  Resp:  16   Temp:  97.9 F (36.6 C)   Weight: 170 lb (77.1 kg)    Height: 5' (1.524 m)      Lungs/Cor:  NAD Abdomen:  soft, gravid Ex:  no cords, erythema SVE:  NA FHTs:  130s, gSTV, NST  R  A/P   For cesarean section at term for breech and oligo SDP 1.4.  All risks, benefits and alternatives discussed with patient and she desires to proceed.  Lorilei Horan A

## 2017-09-15 NOTE — Progress Notes (Signed)
Dr Bradley FerrisEllender notified that pt rating pain as 4-5.  Notified that B/P were 114/50 with no other symptoms.  Orders received for toradol

## 2017-09-15 NOTE — Consult Note (Signed)
Neonatology Note:   Attendance at C-section:    I was asked by Dr. Horvath to attend this primary C/S at 37 1/7 weeks due to breech presentation. The mother is a G1P0 O neg (Ab positive, passively acquired), GBS not done with oligohydramnios. ROM at delivery, fluid clear. Infant delivered frank breech and was vigorous with good spontaneous cry and tone. Delayed cord clamping was done. Needed only minimal bulb suctioning. Ap 9/9. Lungs clear to ausc in DR. Infant is able to remain with his mother for skin to skin time under nursing supervision. Transferred to the care of Pediatrician.   Madalina Rosman C. Geniene List, MD   

## 2017-09-15 NOTE — MAU Note (Signed)
Patient sent over by Dr. Chestine Sporelark for r/o ROM.

## 2017-09-16 ENCOUNTER — Encounter (HOSPITAL_COMMUNITY): Payer: Self-pay | Admitting: Obstetrics and Gynecology

## 2017-09-16 LAB — CBC
HEMATOCRIT: 22.2 % — AB (ref 36.0–46.0)
HEMOGLOBIN: 7.5 g/dL — AB (ref 12.0–15.0)
MCH: 27.6 pg (ref 26.0–34.0)
MCHC: 33.8 g/dL (ref 30.0–36.0)
MCV: 81.6 fL (ref 78.0–100.0)
Platelets: 134 10*3/uL — ABNORMAL LOW (ref 150–400)
RBC: 2.72 MIL/uL — ABNORMAL LOW (ref 3.87–5.11)
RDW: 14.7 % (ref 11.5–15.5)
WBC: 9.6 10*3/uL (ref 4.0–10.5)

## 2017-09-16 LAB — RPR: RPR Ser Ql: NONREACTIVE

## 2017-09-16 MED ORDER — DIBUCAINE 1 % RE OINT
1.0000 | TOPICAL_OINTMENT | RECTAL | Status: DC | PRN
Start: 2017-09-16 — End: 2017-09-17

## 2017-09-16 MED ORDER — ONDANSETRON HCL 4 MG/2ML IJ SOLN: 4.0000 mg | Freq: Three times a day (TID) | INTRAMUSCULAR | Status: DC | PRN

## 2017-09-16 MED ORDER — NALOXONE HCL 0.4 MG/ML IJ SOLN: 0.4000 mg | INTRAMUSCULAR | Status: DC | PRN

## 2017-09-16 MED ORDER — WITCH HAZEL-GLYCERIN EX PADS
1.0000 | MEDICATED_PAD | CUTANEOUS | Status: DC | PRN
Start: 2017-09-16 — End: 2017-09-17

## 2017-09-16 MED ORDER — SODIUM CHLORIDE 0.9% FLUSH: 3.0000 mL | INTRAVENOUS | Status: DC | PRN

## 2017-09-16 NOTE — Discharge Summary (Signed)
Obstetric Discharge Summary Reason for Admission: cesarean section Prenatal Procedures: NST and ultrasound Intrapartum Procedures: cesarean: low cervical, transverse Postpartum Procedures: Rubella Ig Complications-Operative and Postpartum: none Hemoglobin  Date Value Ref Range Status  09/16/2017 7.5 (L) 12.0 - 15.0 g/dL Final    Comment:    REPEATED TO VERIFY DELTA CHECK NOTED    HCT  Date Value Ref Range Status  09/16/2017 22.2 (L) 36.0 - 46.0 % Final    Discharge Diagnoses: Term Pregnancy-delivered  Discharge Information: Date: 09/16/2017 Activity: pelvic rest Diet: routine Medications: Iron and Percocet Condition: stable Instructions: refer to practice specific booklet Discharge to: home Follow-up Information    Carrington ClampHorvath, Janiel Crisostomo, MD Follow up in 4 week(s).   Specialty:  Obstetrics and Gynecology Contact information: [address] [place] [address] [phone]           Newborn Data: Live born female  Birth Weight: 7 lb 6.7 oz (3365 g) APGAR: 9, 9  Newborn Delivery   Birth date/time:  09/15/2017 16:43:00 Delivery type:  C-Section, Low Transverse C-section categorization:  Primary     Home with mother.  Christine Osborn A 09/16/2017, 10:12 AM

## 2017-09-16 NOTE — Progress Notes (Addendum)
  Patient is eating, ambulating, voiding.  Pain control is good.  Vitals:   09/16/17 0100 09/16/17 0233 09/16/17 0333 09/16/17 0619  BP:  (!) 99/41  (!) 96/45  Pulse:  61  61  Resp:  18  18  Temp:  98.9 F (37.2 C)  98.2 F (36.8 C)  TempSrc:  Oral  Oral  SpO2: 98% 96% 98% 96%  Weight:      Height:        lungs:   clear to auscultation cor:    RRR Abdomen:  soft, appropriate tenderness, incisions intact and without erythema or exudate ex:    no cords   Lab Results  Component Value Date   WBC 9.6 09/16/2017   HGB 7.5 (L) 09/16/2017   HCT 22.2 (L) 09/16/2017   MCV 81.6 09/16/2017   PLT 134 (L) 09/16/2017    --/--/O NEG (03/08 1230)/RNI  A/P    Post operative day 1.  MMR.   Baby is A neg- No Rhogam needed.  Routine post op and postpartum care.  Expect d/c routine.  Percocet for pain control.   Anemia- iron and fall precautions.

## 2017-09-16 NOTE — Lactation Note (Signed)
This note was copied from a baby's chart. Lactation Consultation Note Baby 14 hrs old. Has no interest in BF. Baby is tongue thrusting and will not suck. RN has been spoon feeding baby for stimulation. Mom stated that baby has acted like he was going to spit up but hasn't.  Mom has large pendulous breast, flat nipples. Very compressible. Baby would be able to obtain a deep latch had interest in BF. Hand expression w/pouring colostrum. Collected 4 ml colostrum in vial. Discussed milk storage.  Shells given, strongly encouraged to wear, hand pump given to pre-pump prior to latching.  RN setting up DEBP.  Newborn feeding habits, behavior, STS, I&O, cluster feeding, supply and demand.Mom encouraged to feed baby 8-12 times/24 hours and with feeding cues.  Encouraged to call for assistance or questions. Mom is to wake baby up if hasn't BF or cued in 3 hrs.  LC asked mom to hold circumcision until baby BF well. WH/LC brochure given w/resources, support groups and LC services.   Patient Name: Christine Lance SellMakayla Osborn KGMWN'UToday's Date: 09/16/2017 Reason for consult: Initial assessment;Early term 37-38.6wks   Maternal Data Has patient been taught Hand Expression?: Yes Does the patient have breastfeeding experience prior to this delivery?: No  Feeding Feeding Type: Breast Fed Length of feed: 0 min  LATCH Score Latch: Too sleepy or reluctant, no latch achieved, no sucking elicited.  Audible Swallowing: None  Type of Nipple: Flat  Comfort (Breast/Nipple): Soft / non-tender  Hold (Positioning): Full assist, staff holds infant at breast  LATCH Score: 3  Interventions Interventions: Breast feeding basics reviewed;Support pillows;Assisted with latch;Position options;Skin to skin;Expressed milk;Breast massage;Hand express;Shells;Pre-pump if needed;Hand pump;Breast compression;Adjust position;DEBP  Lactation Tools Discussed/Used Tools: Shells;Pump Shell Type: Inverted Breast pump type: Double-Electric  Breast Pump;Manual WIC Program: No Pump Review: Setup, frequency, and cleaning;Milk Storage Initiated by:: L> Emmauel Hallums RN IBCLC Date initiated:: 09/16/17   Consult Status Consult Status: Follow-up Date: 09/16/17 Follow-up type: In-patient    Patrizia Paule, Diamond NickelLAURA G 09/16/2017, 7:02 AM

## 2017-09-16 NOTE — Anesthesia Postprocedure Evaluation (Signed)
Anesthesia Post Note  Patient: Christine Osborn  Procedure(s) Performed: CESAREAN SECTION (N/A )     Patient location during evaluation: Mother Baby Anesthesia Type: Spinal Level of consciousness: awake and alert and oriented Pain management: satisfactory to patient Vital Signs Assessment: post-procedure vital signs reviewed and stable Respiratory status: respiratory function stable and spontaneous breathing Cardiovascular status: blood pressure returned to baseline Postop Assessment: no headache, no backache, spinal receding, patient able to bend at knees and adequate PO intake Anesthetic complications: no    Last Vitals:  Vitals:   09/16/17 0333 09/16/17 0619  BP:  (!) 96/45  Pulse:  61  Resp:  18  Temp:  36.8 C  SpO2: 98% 96%    Last Pain:  Vitals:   09/16/17 0619  TempSrc: Oral  PainSc: 4    Pain Goal: Patients Stated Pain Goal: 2 (09/15/17 1008)               Kionte Baumgardner

## 2017-09-16 NOTE — Lactation Note (Addendum)
This note was copied from a baby's chart. Lactation Consultation Note  Patient Name: Christine Lance SellMakayla Bretado XBJYN'WToday's Date: 09/16/2017 Reason for consult: Follow-up assessment   P1, Baby 18 hours old. Demonstrated how to evert nipple using hand expression and prepumping. Assisted mother w/ latching in cross cradle for 10 min with teacup/breast compression. Sucks and swallows observed. Applied #20NS per mother's request and baby sustained latch for 4 more min. Reviewed how to spoon feed. Mom encouraged to feed baby 8-12 times/24 hours and with feeding cues.  Suggest if mother continues to use NS ask RN to set up DEBP.    Maternal Data Has patient been taught Hand Expression?: Yes Does the patient have breastfeeding experience prior to this delivery?: No  Feeding Feeding Type: Breast Fed Length of feed: 14 min  LATCH Score Latch: Repeated attempts needed to sustain latch, nipple held in mouth throughout feeding, stimulation needed to elicit sucking reflex.  Audible Swallowing: A few with stimulation  Type of Nipple: Flat  Comfort (Breast/Nipple): Soft / non-tender  Hold (Positioning): Assistance needed to correctly position infant at breast and maintain latch.  LATCH Score: 6  Interventions Interventions: Assisted with latch;Skin to skin;Hand express;Hand pump;Shells  Lactation Tools Discussed/Used Tools: Nipple Shields Nipple shield size: 20 Shell Type: Inverted Breast pump type: Double-Electric Breast Pump;Manual WIC Program: No Pump Review: Setup, frequency, and cleaning;Milk Storage Initiated by:: L> Carver RN IBCLC Date initiated:: 09/16/17   Consult Status Consult Status: Follow-up Date: 09/17/17 Follow-up type: In-patient    Dahlia ByesBerkelhammer, Ruth [facility] 09/16/2017, 10:54 AM

## 2017-09-17 ENCOUNTER — Encounter (HOSPITAL_COMMUNITY): Payer: Self-pay | Admitting: *Deleted

## 2017-09-17 MED ORDER — OXYCODONE-ACETAMINOPHEN 5-325 MG PO TABS: 2.0000 | ORAL_TABLET | ORAL | 0 refills | Status: DC | PRN

## 2017-09-17 NOTE — Progress Notes (Signed)
  Patient is eating, ambulating, voiding.  Pain control is good.  Vitals:   09/16/17 0619 09/16/17 1055 09/16/17 1458 09/16/17 1742  BP: (!) 96/45 (!) 111/41 (!) 108/52 111/65  Pulse: 61 68 85 77  Resp: _0 Temp: 98.2 F (36.8 C) 98.7 F (37.1 C)  98.4 F (36.9 C)  TempSrc: Oral Tympanic  Oral  SpO2: 96% 98% 98% 98%  Weight:      Height:        lungs:   clear to auscultation cor:    RRR Abdomen:  soft, appropriate tenderness, incisions intact and without erythema or exudate ex:    no cords   Lab Results  Component Value Date   WBC 9.6 09/16/2017   HGB 7.5 (L) 09/16/2017   HCT 22.2 (L) 09/16/2017   MCV 81.6 09/16/2017   PLT 134 (L) 09/16/2017    --/--/O NEG (03/08 1230)/RNI  A/P    Post operative day 2.  MMR given.  Baby A Neg- No Rhogam/  Routine post op and postpartum care.  Expect d/c today.  Percocet for pain control.    Parents desires circumcision but baby on bili lights.  Unable to do today.

## 2017-09-17 NOTE — Lactation Note (Signed)
This note was copied from a baby's chart. Lactation Consultation Note  Patient Name: Christine Osborn ONGEX'BToday's Date: 09/17/2017 Reason for consult: Follow-up assessment;Infant weight loss;Hyperbilirubinemia;Early term 37-38.6wks;Primapara;1st time breastfeeding  2344 hours old female who is being exclusively BF by his mother. Baby is still on phototherapy, dad had baby on blue blanket when entering the room. Mom feels more confident about BF now that she's using the NS. She stated that feedings at the breast were comfortable and the pain/discomfort she felt before has subsided.   She stated though that she hasn't been pumping every 3 hours, she mentioned that she didn't get "anything" out with the Symphony pump. LC checked DEBP and noticed that the tubing that attaches to the membranes was loose and pump had barely any suction. Readjusted the tubing and asked mom to try again. She'll let us know on next feeding.   Mom verbalized though that she feels more comfortable with hand expression rather than pumping; and that she has been able to get about 3 ml on the snappies doing hand expression. Explained to mom the importance of consistent milk removal every 3 hours whether by pumping or hand expression to supplement baby.   Encouraged mom to wake baby up for feedings every 3 hours and put him STS, also, feed baby whenever he's showing feeding cues. Parents are aware that baby needs supplementation with mother's milk and that any amount they get is valuable and they'll feed it back to baby right away. They are aware of LC services and will call PRN.  Interventions Interventions: Breast feeding basics reviewed;DEBP  Lactation Tools Discussed/Used     Consult Status Consult Status: Follow-up Date: 09/18/17 Follow-up type: In-patient    Raahil Ong Venetia ConstableS Shelle Galdamez 09/17/2017, 1:26 PM

## 2017-09-18 ENCOUNTER — Encounter (HOSPITAL_COMMUNITY): Payer: Self-pay | Admitting: *Deleted

## 2017-09-18 ENCOUNTER — Ambulatory Visit: Payer: Self-pay

## 2017-09-18 NOTE — Lactation Note (Signed)
This note was copied from a baby's chart. Lactation Consultation Note RN in formed baby's bili had risen to 13. LC discussed supplementing after BF. Encouraged mom to give colostrum first then give formula to equal amount to be given. Supplementing sheet given as well. Mom asked if they were going home today, LC stated I doubted it, baby will probably be on DPT another 24 hrs until bili level starts to lower.  Mom started crying.   Patient Name: Christine Osborn YNWGN'FToday's Date: 09/18/2017 Reason for consult: Follow-up assessment;Hyperbilirubinemia;Infant weight loss   Maternal Data    Feeding Feeding Type: Breast Fed Length of feed: 15 min  LATCH Score Latch: Repeated attempts needed to sustain latch, nipple held in mouth throughout feeding, stimulation needed to elicit sucking reflex.  Audible Swallowing: A few with stimulation  Type of Nipple: Flat  Comfort (Breast/Nipple): Filling, red/small blisters or bruises, mild/mod discomfort  Hold (Positioning): Assistance needed to correctly position infant at breast and maintain latch.  LATCH Score: 5  Interventions    Lactation Tools Discussed/Used Pump Review: Milk Storage;Setup, frequency, and cleaning   Consult Status Consult Status: Follow-up Date: 09/19/17 Follow-up type: In-patient    Charyl DancerCARVER, Maurie Olesen G 09/18/2017, 6:27 AM

## 2017-09-18 NOTE — Lactation Note (Signed)
This note was copied from a baby's chart. Lactation Consultation Note  Patient Name: Christine Lance SellMakayla Husk ZOXWR'UToday's Date: 09/18/2017 Reason for consult: Follow-up assessment;Infant weight loss;Hyperbilirubinemia Mom is now pumping 20-50 mls and bottle feeding expressed milk and formula.  Instructed to change pump setting to standard.  Encouraged to pump every 2-3 hours and call for assist prn.  Maternal Data    Feeding    LATCH Score                   Interventions    Lactation Tools Discussed/Used     Consult Status      Huston FoleyMOULDEN, Harshith Pursell S 09/18/2017, 1:32 PM

## 2017-09-18 NOTE — Lactation Note (Signed)
This note was copied from a baby's chart. Lactation Consultation Note Baby 5861 hrs old. Wt. 6.10. Had 5% wt. Loss. Baby has had 3 stools and 10 voids in life.  Baby is on DPT. Hasn't had stool since 3/10 midnight. Mom is using DEBP now and will give colostrum to baby after BF. encouraged breast massage during feeding and post pumping.  Reported to RN of mom to supplement w/BM after BF. Encouraged mom to pump every 3 hrs.   Patient Name: Christine Osborn Reason for consult: Follow-up assessment;Hyperbilirubinemia;Infant weight loss   Maternal Data    Feeding Feeding Type: Breast Fed Length of feed: 15 min  LATCH Score Latch: Repeated attempts needed to sustain latch, nipple held in mouth throughout feeding, stimulation needed to elicit sucking reflex.  Audible Swallowing: A few with stimulation  Type of Nipple: Flat  Comfort (Breast/Nipple): Filling, red/small blisters or bruises, mild/mod discomfort  Hold (Positioning): Assistance needed to correctly position infant at breast and maintain latch.  LATCH Score: 5  Interventions    Lactation Tools Discussed/Used Pump Review: Milk Storage;Setup, frequency, and cleaning   Consult Status Consult Status: Follow-up Date: 09/19/17 Follow-up type: In-patient    Charyl DancerCARVER, Janiyah Beery G Osborn, 5:55 AM

## 2017-09-19 ENCOUNTER — Ambulatory Visit: Payer: Self-pay

## 2017-09-19 LAB — BPAM RBC
Blood Product Expiration Date: 201903192359
Blood Product Expiration Date: 201904022359
Blood Product Expiration Date: 201904042359
ISSUE DATE / TIME: 201903090803
Unit Type and Rh: 9500
Unit Type and Rh: 9500
Unit Type and Rh: 9500

## 2017-09-19 LAB — TYPE AND SCREEN
ABO/RH(D): O NEG
ANTIBODY SCREEN: POSITIVE
UNIT DIVISION: 0
Unit division: 0
Unit division: 0

## 2017-09-19 NOTE — Lactation Note (Signed)
This note was copied from a baby's chart. Lactation Consultation Note  Patient Name: [name] GNFAO'ZToday's Date: 09/19/2017 Reason for consult: Follow-up assessment  Infant sleeping and off phototherapy.  Per mom, another bilirubin level will be drawn later this afternoon.  If the level is appropriate baby will be discharged.  Mother states she is pumping 3 ounces at a time and has no questions concerning breastfeeding or pumping.  Reviewed breastfeeding support groups and the outpatient number to call as needed.    Maternal Data Has patient been taught Hand Expression?: Yes  Feeding Feeding Type: Formula  LATCH Score                   Interventions    Lactation Tools Discussed/Used     Consult Status Consult Status: Complete Date: 09/19/17 Follow-up type: In-patient    Christine Osborn Christine Osborn 09/19/2017, 11:43 AM

## 2020-02-11 ENCOUNTER — Other Ambulatory Visit: Payer: Self-pay

## 2020-02-11 ENCOUNTER — Other Ambulatory Visit: Payer: Self-pay | Admitting: Nurse Practitioner

## 2020-02-11 ENCOUNTER — Inpatient Hospital Stay (HOSPITAL_COMMUNITY)
Admission: AD | Admit: 2020-02-11 | Discharge: 2020-02-11 | Disposition: A | Discharge status: Home / Self Care | Payer: BLUE CROSS/BLUE SHIELD | Attending: Obstetrics and Gynecology | Admitting: Obstetrics and Gynecology

## 2020-02-11 ENCOUNTER — Encounter (HOSPITAL_COMMUNITY): Payer: Self-pay | Admitting: Obstetrics and Gynecology

## 2020-02-11 DIAGNOSIS — O26892 Other specified pregnancy related conditions, second trimester: Secondary | ICD-10-CM | POA: Insufficient documentation

## 2020-02-11 DIAGNOSIS — O26852 Spotting complicating pregnancy, second trimester: Secondary | ICD-10-CM | POA: Insufficient documentation

## 2020-02-11 DIAGNOSIS — R109 Unspecified abdominal pain: Secondary | ICD-10-CM | POA: Insufficient documentation

## 2020-02-11 DIAGNOSIS — R45851 Suicidal ideations: Secondary | ICD-10-CM | POA: Insufficient documentation

## 2020-02-11 DIAGNOSIS — F329 Major depressive disorder, single episode, unspecified: Secondary | ICD-10-CM

## 2020-02-11 DIAGNOSIS — O26851 Spotting complicating pregnancy, first trimester: Secondary | ICD-10-CM

## 2020-02-11 DIAGNOSIS — O209 Hemorrhage in early pregnancy, unspecified: Secondary | ICD-10-CM

## 2020-02-11 DIAGNOSIS — Z3A17 17 weeks gestation of pregnancy: Secondary | ICD-10-CM | POA: Insufficient documentation

## 2020-02-11 DIAGNOSIS — O99342 Other mental disorders complicating pregnancy, second trimester: Secondary | ICD-10-CM | POA: Insufficient documentation

## 2020-02-11 HISTORY — DX: Depression, unspecified: F32.A

## 2020-02-11 LAB — URINALYSIS, ROUTINE W REFLEX MICROSCOPIC
Bilirubin Urine: NEGATIVE
Glucose, UA: NEGATIVE mg/dL
Hgb urine dipstick: NEGATIVE
Ketones, ur: 20 mg/dL — AB
Leukocytes,Ua: NEGATIVE
Nitrite: NEGATIVE
Protein, ur: NEGATIVE mg/dL
Specific Gravity, Urine: 1.023 (ref 1.005–1.030)
pH: 5 (ref 5.0–8.0)

## 2020-02-11 LAB — WET PREP, GENITAL
Clue Cells Wet Prep HPF POC: NONE SEEN
Sperm: NONE SEEN
Trich, Wet Prep: NONE SEEN
Yeast Wet Prep HPF POC: NONE SEEN

## 2020-02-11 LAB — CBC
HCT: 32.1 % — ABNORMAL LOW (ref 36.0–46.0)
Hemoglobin: 11.1 g/dL — ABNORMAL LOW (ref 12.0–15.0)
MCH: 29.8 pg (ref 26.0–34.0)
MCHC: 34.6 g/dL (ref 30.0–36.0)
MCV: 86.1 fL (ref 80.0–100.0)
Platelets: 165 10*3/uL (ref 150–400)
RBC: 3.73 MIL/uL — ABNORMAL LOW (ref 3.87–5.11)
RDW: 13.5 % (ref 11.5–15.5)
WBC: 7.4 10*3/uL (ref 4.0–10.5)
nRBC: 0 % (ref 0.0–0.2)

## 2020-02-11 MED ORDER — ESCITALOPRAM OXALATE 5 MG PO TABS
5.0000 mg | ORAL_TABLET | Freq: Every day | ORAL | 0 refills | Status: DC
Start: 2020-02-11 — End: 2020-07-14

## 2020-02-11 MED ORDER — ESCITALOPRAM OXALATE 5 MG PO TABS
5.0000 mg | ORAL_TABLET | Freq: Every day | ORAL | Status: DC
  Filled 2020-02-11: qty 1

## 2020-02-11 MED ORDER — ACETAMINOPHEN 325 MG PO TABS
650.0000 mg | ORAL_TABLET | Freq: Once | ORAL | Status: AC
  Administered 2020-02-11: 650 mg via ORAL
  Filled 2020-02-11: qty 2

## 2020-02-11 MED ORDER — RHO D IMMUNE GLOBULIN 1500 UNIT/2ML IJ SOSY
300.0000 ug | PREFILLED_SYRINGE | Freq: Once | INTRAMUSCULAR | Status: AC
  Administered 2020-02-11: 300 ug via INTRAMUSCULAR
  Filled 2020-02-11: qty 2

## 2020-02-11 NOTE — MAU Note (Signed)
Presents with c/o light pink spotting with wiping and abdominal cramping.  Reports last intercourse 3 days ago.  Also states she's to be evaluated for depression per OB/Gyn office.  Reports having thoughts of harming herself, frequent crying, fatigue, and no sleeping or eating.

## 2020-02-11 NOTE — BH Assessment (Signed)
Tele Assessment Note   Patient Name: Christine Osborn MRN: [id number] Location of Patient: [facility] MAU Location of Provider: Behavioral Health TTS Department  Disposition: Domingo Madeira recommends psychiatric clearance. Pt is recommended to follow up with scheduled counseling and psychiatrist appointments.  Diagnosis: MDD, recurrent, severe, without sx of psychosis  Christine Osborn is a married 24 y.o. female who presents voluntarily to MAU for pregnancy appointment when she reported symptoms of depression with passive suicidal ideation. Pt is currently [redacted] weeks pregnant with her 2nd child. She reports a long history of Depression. She states she went of Wellbutrin went she learned she was pregnant, about 13 weeks ago. Pt acknowledges multiple symptoms of Depression, including anhedonia, isolating, feelings of worthlessness & guilt, tearfulness, changes in sleep & appetite, & increased irritability. She reports passive SI/ feeling like it would be okay if she went to sleep and didn't wake up. Pt denies suicide plan. She denies past suicide attempts. She denies homicidal ideation/ history of violence. Pt denies auditory & visual hallucinations & other symptoms of psychosis. Pt states current stressors "usual stressors" of caring for her busy 25 yo, working full-time, limited social support and financial.   Pt lives with her spouse, Christine Osborn [phone] and her 2 yo. Supports include her spouse and coworkers. Pt denies hx of abuse and trauma. Pt has good insight and judgment. Pt's memory is  Intact. Legal history includes no charges.  Pt gave verbal authorization for collateral contact with her spouse. By phone, Christine Osborn reports he knows about pt's difficulty with depression. He states he has no concerns regarding her safety to return home. He was advised that any guns, sharps, and bottles of pills in home should be removed or secured.  Protective factors against suicide include good family support, future  orientation, no access to firearms, no current psychotic symptoms and no prior attempts.?  Pt has no OP or IP tx history. She denies alcohol/ substance abuse. ? MSE: Pt is in a hospital gown alert, tearful, oriented x5 with normal speech and normal motor behavior. Eye contact is good. Pt's mood is depressed and affect is sad. Affect is congruent with mood. Thought process is coherent and relevant. There is no indication pt is currently responding to internal stimuli or experiencing delusional thought content. Pt was cooperative throughout assessment.   [facility] psychiatrist and counselor appointments are printed on pt's discharge information. Christine Magnuson, NP prescribed 5mg  Lexapro.    Past Medical History:  Past Medical History:  Diagnosis Date   Depression    Morning sickness 03/14/2017    Past Surgical History:  Procedure Laterality Date   CESAREAN SECTION N/A 09/15/2017   Procedure: CESAREAN SECTION;  Surgeon: 11/15/2017, MD;  Location: [facility] BIRTHING SUITES;  Service: Obstetrics;  Laterality: N/A;    Family History:  Family History  Problem Relation Age of Onset   Hypertension Mother    Hypertension Father     Social History:  reports that she has never smoked. She has never used smokeless tobacco. She reports that she does not drink alcohol and does not use drugs.  Additional Social History:  Alcohol / Drug Use Pain Medications: denies Prescriptions: nausea medication Over the Counter: tylenol every now and then for headaches History of alcohol / drug use?: No history of alcohol / drug abuse  CIWA: CIWA-Ar BP: 124/67 Pulse Rate: 91 COWS:    Allergies:  Allergies  Allergen Reactions   Penicillins Hives    Has patient had a PCN reaction causing  immediate rash, facial/tongue/throat swelling, SOB or lightheadedness with hypotension: no Has patient had a PCN reaction causing severe rash involving mucus membranes or skin necrosis: no Has patient had a PCN  reaction that required hospitalization: unknown Has patient had a PCN reaction occurring within the last 10 years: no If all of the above answers are "NO", then may proceed with Cephalosporin use.     Home Medications:  Medications Prior to Admission  Medication Sig Dispense Refill   oxyCODONE-acetaminophen (PERCOCET/ROXICET) 5-325 MG tablet Take 2 tablets by mouth every 4 (four) hours as needed (pain scale > 7). 30 tablet 0    OB/GYN Status:  No LMP recorded (lmp unknown). Patient is pregnant.  General Assessment Data Location of Assessment: [facility] ED TTS Assessment: In system Is this a Tele or Face-to-Face Assessment?: Tele Assessment Is this an Initial Assessment or a Re-assessment for this encounter?: Initial Assessment Patient Accompanied by:: N/A Language Other than English: No Living Arrangements: Other (Comment) (husband and son) What gender do you identify as?: Female Date Telepsych consult ordered in [facility]: 02/11/20 Marital status: Married [place] name: Rich Pregnancy Status: Yes (Comment: include estimated delivery date) (Jan.7, 2022) Living Arrangements: Spouse/significant other, Children Can pt return to current living arrangement?: Yes Admission Status: Voluntary Is patient capable of signing voluntary admission?: Yes Referral Source: MD (MAU provider)     Crisis Care Plan Living Arrangements: Spouse/significant other, Children Name of Psychiatrist: none Name of Therapist: none  Education Status Is patient currently in school?: No Is the patient employed, unemployed or receiving disability?: Employed (full-time)  Risk to self with the past 6 months Suicidal Ideation: Yes-Currently Present Has patient been a risk to self within the past 6 months prior to admission? : No Suicidal Intent: No Is patient at risk for suicide?: Yes Suicidal Plan?: No Has patient had any suicidal plan within the past 6 months prior to admission? : No Access to Means: No What has  been your use of drugs/alcohol within the last 12 months?: denies Previous Attempts/Gestures: No Triggers for Past Attempts:  (n/a) Intentional Self Injurious Behavior: None Family Suicide History: No, Unknown Recent stressful life event(s): Financial Problems ("normal stress of finances and caring for 25yo") Persecutory voices/beliefs?: No Depression: Yes Depression Symptoms: Despondent, Insomnia, Tearfulness, Isolating, Fatigue, Guilt, Loss of interest in usual pleasures, Feeling worthless/self pity, Feeling angry/irritable Substance abuse history and/or treatment for substance abuse?: No Suicide prevention information given to non-admitted patients: Yes  Risk to Others within the past 6 months Homicidal Ideation: No Does patient have any lifetime risk of violence toward others beyond the six months prior to admission? : No Thoughts of Harm to Others: No Current Homicidal Intent: No Current Homicidal Plan: No Access to Homicidal Means: No History of harm to others?: No Assessment of Violence: None Noted Does patient have access to weapons?: No Criminal Charges Pending?: No Does patient have a court date: No Is patient on probation?: No  Psychosis Hallucinations: None noted Delusions: None noted  Mental Status Report Appearance/Hygiene: Other (Comment) (tired) Eye Contact: Good Motor Activity: Freedom of movement Speech: Logical/coherent Level of Consciousness: Alert, Crying Mood: Depressed, Pleasant Affect: Constricted, Sad Anxiety Level: Minimal Thought Processes: Coherent, Relevant Judgement: Unimpaired Orientation: Appropriate for developmental age Obsessive Compulsive Thoughts/Behaviors: None  Cognitive Functioning Concentration: Good Memory: Recent Intact, Remote Intact Is patient IDD: No Insight: Good Impulse Control: Good Appetite: Poor Have you had any weight changes? : Loss Sleep: Decreased Total Hours of Sleep:  (varies) Vegetative Symptoms: Staying in  bed, Not bathing, Decreased grooming  ADLScreening [facility]) Patient's cognitive ability adequate to safely complete daily activities?: Yes Patient able to express need for assistance with ADLs?: Yes Independently performs ADLs?: Yes (appropriate for developmental age)  Prior Inpatient Therapy Prior Inpatient Therapy: No  Prior Outpatient Therapy Prior Outpatient Therapy: No Does patient have an ACCT team?: No Does patient have Intensive In-House Services?  : No Does patient have [facility] services? : No Does patient have P4CC services?: No  ADL Screening (condition at time of admission) Patient's cognitive ability adequate to safely complete daily activities?: Yes Is the patient deaf or have difficulty hearing?: No Does the patient have difficulty seeing, even when wearing glasses/contacts?: No Does the patient have difficulty concentrating, remembering, or making decisions?: No Patient able to express need for assistance with ADLs?: Yes Does the patient have difficulty dressing or bathing?: No Independently performs ADLs?: Yes (appropriate for developmental age) Does the patient have difficulty walking or climbing stairs?: No Weakness of Legs: None Weakness of Arms/Hands: None  Home Assistive Devices/Equipment Home Assistive Devices/Equipment: None  Therapy Consults (therapy consults require a physician order) PT Evaluation Needed: No OT Evalulation Needed: No SLP Evaluation Needed: No Abuse/Neglect Assessment (Assessment to be complete while patient is alone) Abuse/Neglect Assessment Can Be Completed: Yes Physical Abuse: Denies Verbal Abuse: Denies Sexual Abuse: Denies Exploitation of patient/patient's resources: Denies Self-Neglect: Denies Values / Beliefs Cultural Requests During Hospitalization: None Spiritual Requests During Hospitalization: None Consults Spiritual Care Consult Needed: No Transition of Care Team Consult Needed: No Advance  Directives (For Healthcare) Does Patient Have a Medical Advance Directive?: No Would patient like information on creating a medical advance directive?: No - Patient declined Nutrition Screen- [facility] Adult/[facility]/AP Patient's home diet: Regular       Disposition: La Lolly Mustache recommends psychiatric clearance. Pt is recommended to take medication as directed and follow up with scheduled counseling and psychiatrist appointments.  Diagnosis: MDD, recurrent, severe, without sx of psychosis  Disposition Initial Assessment Completed for this Encounter: Yes    Raelea Gosse Suzan Nailer 02/11/2020 2:17 PM

## 2020-02-11 NOTE — MAU Note (Signed)
Called [facility] to confirm they received the order for the TTS consult. Stated they would give me a call back when they are ready for the patient's consult.

## 2020-02-11 NOTE — MAU Provider Note (Signed)
History     CSN: [id number]  Arrival date and time: 02/11/20 1112   First Provider Initiated Contact with Patient 02/11/20 1202      Chief Complaint  Patient presents with  . Vaginal Bleeding  . Abdominal Pain  . Depression   HPI Christine Osborn 24 y.o. [redacted]w[redacted]d  Comes to MAU with spotting and cramping.  Has had cramping with spotting a couple of weeks ago and now has spotting and cramping since earlier today.  Called the office and was told to come in for evaluation.  Also client admits to crying and depression that started in this pregnancy.  Has had depression in the past from ages [age] and was on Wellbutrin.  When she became pregnant the first time, she weaned off her meds and has continued to be off medication with no problem until recently in this pregnancy.  Has no specific plan for suicide, however reports she has been wishing she would not wake up.  Has not slept in a few days.  Decreased appetite and is not eating 3 meals a day.  Reports crying a lot at home and is tearful here in triage and while provider was in the room.   OB History    Gravida  2   Para  1   Term  1   Preterm      AB      Living  1     SAB      TAB      Ectopic      Multiple      Live Births  1           Past Medical History:  Diagnosis Date  . Depression   . Morning sickness 03/14/2017    Past Surgical History:  Procedure Laterality Date  . CESAREAN SECTION N/A 09/15/2017   Procedure: CESAREAN SECTION;  Surgeon: Carrington Clamp, MD;  Location: [facility];  Service: Obstetrics;  Laterality: N/A;    Family History  Problem Relation Age of Onset  . Hypertension Mother   . Hypertension Father     Social History   Tobacco Use  . Smoking status: Never Smoker  . Smokeless tobacco: Never Used  Substance Use Topics  . Alcohol use: No  . Drug use: No    Allergies:  Allergies  Allergen Reactions  . Penicillins Hives    Has patient had a PCN reaction causing  immediate rash, facial/tongue/throat swelling, SOB or lightheadedness with hypotension: no Has patient had a PCN reaction causing severe rash involving mucus membranes or skin necrosis: no Has patient had a PCN reaction that required hospitalization: unknown Has patient had a PCN reaction occurring within the last 10 years: no If all of the above answers are "NO", then may proceed with Cephalosporin use.     Medications Prior to Admission  Medication Sig Dispense Refill Last Dose  . oxyCODONE-acetaminophen (PERCOCET/ROXICET) 5-325 MG tablet Take 2 tablets by mouth every 4 (four) hours as needed (pain scale > 7). 30 tablet 0 Unknown at Unknown time    Review of Systems  Constitutional: Negative for fever.  Respiratory: Negative for cough, shortness of breath and wheezing.   Gastrointestinal: Positive for abdominal pain. Negative for diarrhea, nausea and vomiting.  Genitourinary: Positive for vaginal bleeding. Negative for dysuria and vaginal discharge.  Neurological: Negative for headaches.  Psychiatric/Behavioral: Positive for dysphoric mood and sleep disturbance.   Physical Exam   Blood pressure 124/67, pulse 91, temperature 99 F (37.2 C),  temperature source Oral, resp. rate 19, height 5' (1.524 m), weight 64.4 kg, SpO2 100 %, unknown if currently breastfeeding.  Physical Exam Vitals and nursing note reviewed.  Constitutional:      Appearance: She is well-developed.  HENT:     Head: Normocephalic.  Abdominal:     Palpations: Abdomen is soft.     Tenderness: There is abdominal tenderness. There is no guarding or rebound.     Comments: Tender all across lower abdomen FHT 168 by doppler - done by RN  Genitourinary:    Comments: Speculum exam: Vagina - Small amount of white discharge, no active bleeding, no blood seen in vagina Cervix - No contact bleeding Bimanual exam: Cervix closed Uterus non tender, gravid Adnexa non tender, no masses bilaterally GC/Chlam, wet prep  done Chaperone present for exam.  Musculoskeletal:        General: Normal range of motion.     Cervical back: Neck supple.  Skin:    General: Skin is warm and dry.     Comments: Nails are small and down to the level of the nail bed.  Client unconsciously peeling at nails when talking.  Neurological:     Mental Status: She is alert and oriented to person, place, and time.     MAU Course  Procedures Labs Results for orders placed or performed during the hospital encounter of 02/11/20 (from the past 24 hour(s))  Urinalysis, Routine w reflex microscopic     Status: Abnormal   Collection Time: 02/11/20 11:54 AM  Result Value Ref Range   Color, Urine YELLOW YELLOW   APPearance HAZY (A) CLEAR   Specific Gravity, Urine 1.023 1.005 - 1.030   pH 5.0 5.0 - 8.0   Glucose, UA NEGATIVE NEGATIVE mg/dL   Hgb urine dipstick NEGATIVE NEGATIVE   Bilirubin Urine NEGATIVE NEGATIVE   Ketones, ur 20 (A) NEGATIVE mg/dL   Protein, ur NEGATIVE NEGATIVE mg/dL   Nitrite NEGATIVE NEGATIVE   Leukocytes,Ua NEGATIVE NEGATIVE  Wet prep, genital     Status: Abnormal   Collection Time: 02/11/20 12:28 PM  Result Value Ref Range   Yeast Wet Prep HPF POC NONE SEEN NONE SEEN   Trich, Wet Prep NONE SEEN NONE SEEN   Clue Cells Wet Prep HPF POC NONE SEEN NONE SEEN   WBC, Wet Prep HPF POC MANY (A) NONE SEEN   Sperm NONE SEEN   CBC     Status: Abnormal   Collection Time: 02/11/20 12:29 PM  Result Value Ref Range   WBC 7.4 4.0 - 10.5 K/uL   RBC 3.73 (L) 3.87 - 5.11 MIL/uL   Hemoglobin 11.1 (L) 12.0 - 15.0 g/dL   HCT 93.9 (L) 36 - 46 %   MCV 86.1 80.0 - 100.0 fL   MCH 29.8 26.0 - 34.0 pg   MCHC 34.6 30.0 - 36.0 g/dL   RDW 03.0 09.2 - 33.0 %   Platelets 165 150 - 400 K/uL   nRBC 0.0 0.0 - 0.2 %  Rh IG workup (includes ABO/Rh)     Status: None (Preliminary result)   Collection Time: 02/11/20 12:29 PM  Result Value Ref Range   Gestational Age(Wks) 17.4    ABO/RH(D) O NEG    Antibody Screen NEG    Unit  Number [id number]    Blood Component Type RHIG    Unit division 00    Status of Unit ISSUED    Transfusion Status      OK TO TRANSFUSE Performed at [facility]  [facility], [address] [place], [place] [address]     MDM Blood type O negative.  With reported spotting, will give rhogam - given Recurrence of depression even though she did not have problems with depression in her last pregnancy.  Will consult telepsych today for further evaluation. Telepsych saw patient virtually.  Identified suicidal ideations but no plan for suicide.  Spoke with her about making her home environment more safe - no guns, no pills, and making a plan for further action if her suicidal thoughts become more frequent or more severe.  prescribed Lexapro 5 mg daily for her to begin and made an outpatient appointment for her.  Discussed keeping or rescheduling the appointment as this is a necessary part of her treatment for depression.  Reviewed that psych meds may take several weeks to improve her depression.  Advised to continue counseling through the pregnancy and for 2-3 months in the postpartum period.  Also had a headache while she was in MAU but tylenol given and headache was resolved. Had US done in the office earlier in this pregnancy - with long and closed cervix and no blood in the vagina, did not do [place] today.  Assessment and Plan  Spotting and cramping in pregnancy Recurrence of depression from 2018  Plan Call the office if your symptoms worsen and be seen before your next appointment. Plan to pick up the medication for depression from your pharmacy and begin today.  Call the office if there is any problem with getting the medication. Make your home a safe place as discussed with the behavioral health provider. Keep the appointment or reschedule the appointment with the behavioral health provider. Seek additional care immediately if your symptoms worsen.  See the list of resources below.    Lavin Petteway L  Keyvon Herter 02/11/2020, 12:26 PM

## 2020-02-11 NOTE — Progress Notes (Signed)
Patient ID: Christine Osborn, female   DOB: April 04, 1996, 24 y.o.   MRN: [id number]   Case discussed with provider by TTS counselor. Per counselor patient is [redacted] weeks gestation. She presented with symptoms of depression and passive suicidal thoughts . Per TTS counselor, patient denied plan or intent. Patient inquired about antidepressant medication which she stated that she had been on Wellbutrin in the past. For depression management, I have suggested starting a trial of Lexapro 5 mg po daily which is a catagory C medication and like other SSRI's, does not appear to be associated with major pregnancy complications. Per counselor, patient also had concerns with nausea and Lexapro is generally associated with less GI side effects.   Aside from medication management, TTS counselor spoke to patients husband who had no concerns for safety of patient was discharged. Because patient presented without evidence of imminent risk to self or others at present, it was determined that she did not meet criteria for psychiatric inpatient admission and she was psychiatrically cleared. TTS counselor made referrals for outpatient psychiatric services (therapy and medication management) which will be provided to patient. TTS counselor further discussed a safety plan with patients husband.   ED updated on disposition. I have placed the order for Lexapro 5 mg po daily for depression and I am recommending  that the EDP provide a prescription for a 30 day supply. Patient will have ongoing medication management with the outpatient psychiatric provider that she was referred to.

## 2020-02-11 NOTE — Discharge Instructions (Signed)
Call the office if your symptoms worsen and be seen before your next appointment. Plan to pick up the medication for depression from your pharmacy and begin today.  Call the office if there is any problem with getting the medication. Make your home a safe place as discussed with the behavioral health provider. Keep the appointment or reschedule the appointment with the behavioral health provider. Seek additional care immediately if your symptoms worsen.  See the list of resources below.   Psychiatric [facility]  [address] [place], [place] [phone]  [facility] [address], [place], [place] [phone] or [phone] [email or web address]  [facility], 8:30-5:00 [address] [place], [place] [phone] [email or web address]  *Bring your own interpreter at 1st visit  [facility] [address], [place], [place] [phone] [email or web address]   [facility] Services/[facility] Services  [address] [place], [place] [phone]  [facility] Mon-Fri, 8am-3pm [address], [place], [place] [phone] [email or web address]  [facility] [address] [place], [place], [place] [phone]  [facility] [address], [place], [place] [phone]  [facility] [place] [address], [place], [place] [phone] *pacientes que hablen espanol, favor comunicarse con el Sr[address] [place], extension [address] o la [address], extension [place] para hacer una cita.   [facility] [address] [place] [phone] (Habla Espanol)  [facility] [address] [place], [place], [place] [phone]  [facility] [address], [place],  [place] [phone]   [facility]ing All Survivors)  [address], [place], [place] [phone] [email or web address]  *Uninsured and underinsured, ages 19-64  [facility] [address] [place], [place], [place] [phone] (Habla Espanol)  [facility] [address], [place], [place] [phone] (Habla Espanol)  [facility] [address] [place], [place] [phone] Clarice Pole)  [facility] Mon-Thurs 8:30am-8:00pm/ Fri 8:30-7:00pm [address], [place], [place] (3rd floor, located at corner of [address] and [employer]) Call [phone] to schedule an appointment [email or web address]  [facility] [address] [place], [place]  [phone]  [facility]  [address] [place], [place] [phone]  Social Support MHAG ([facility] [place]) [phone] or [email or web address] [address], [place], [place] [address] * Recovery support and educational programs, including recovery skills classes, support groups, and one-on-one sessions with [place] Certified Peer Support Specialists.    NAMI [facility] of the Mentally Ill) [place] NAMI Helpline: 5120749396 * Family and Friends Support Group/  Contact Philomena Doheny at [phone] for more information * Family to [employer] and Basics Class : enroll online or email Darreld Mclean at [email or web address]  * Monthly educational meetings, contact [employer] at [phone] [email or web address]    24- Hour Availability:  [phone] Behavioral Health [phone] or 1-5405845137  * Family Service of the 11-06-1994 (Domestic Violence, Rape, Victim Assistance) Line [phone]  [phone] or [phone]  * [facility] [place] [facility]  6016072578  only9522876705 hours)  *Therapeutic Alternative Mobile Crisis Unit (828)884-6244  *7-654-650-3546 National Suicide Hotline (725)134-9377        Living With Depression Everyone experiences occasional disappointment, sadness, and loss in their lives. When you are feeling down, blue, or sad for at least 2 weeks in a row, it may mean that you have depression. Depression can affect your thoughts  and feelings, relationships, daily activities, and physical health. It is caused by changes in the way your brain functions. If you receive a diagnosis of depression, your health care provider will tell you which type of depression you have and what treatment options are available to you. If you are living with depression, there are ways to help you recover from it and also ways to prevent it from coming back. How to cope with lifestyle changes Coping with stress     Stress is your bodys reaction to life changes and events, both good and bad. Stressful situations may include:  Getting married.  The death of a spouse.  Losing a job.  Retiring.  Having a baby. Stress can last just a few hours or it can be ongoing. Stress can play a major role in depression, so it is important to learn both how to cope with stress and how to think about it differently. Talk with your health care provider or a counselor if you would like to learn more about stress reduction. He or she may suggest some stress reduction techniques, such as:  Music therapy. This can include creating music or listening to music. Choose music that you enjoy and that inspires you.  Mindfulness-based meditation. This kind of meditation can be done while sitting or walking. It involves being aware of your normal breaths, rather than trying to control your breathing.  Centering prayer. This is a kind of meditation that involves focusing on a spiritual word or phrase. Choose a word, phrase, or sacred image that is meaningful to you  and that brings you peace.  Deep breathing. To do this, expand your stomach and inhale slowly through your nose. Hold your breath for 3-5 seconds, then exhale slowly, allowing your stomach muscles to relax.  Muscle relaxation. This involves intentionally tensing muscles then relaxing them. Choose a stress reduction technique that fits your lifestyle and personality. Stress reduction techniques take time and practice to develop. Set aside 5-15 minutes a day to do them. Therapists can offer training in these techniques. The training may be covered by some insurance plans. Other things you can do to manage stress include:  Keeping a stress diary. This can help you learn what triggers your stress and ways to control your response.  Understanding what your limits are and saying no to requests or events that lead to a schedule that is too full.  Thinking about how you respond to certain situations. You may not be able to control everything, but you can control how you react.  Adding humor to your life by watching funny films or TV shows.  Making time for activities that help you relax and not feeling guilty about spending your time this way.  Medicines Your health care provider may suggest certain medicines if he or she feels that they will help improve your condition. Avoid using alcohol and other substances that may prevent your medicines from working properly (may interact). It is also important to:  Talk with your pharmacist or health care provider about all the medicines that you take, their possible side effects, and what medicines are safe to take together.  Make it your goal to take part in all treatment decisions (shared decision-making). This includes giving input on the side effects of medicines. It is best if shared decision-making with your health care provider is part of your total treatment plan. If your health care provider prescribes a medicine, you may not notice the full benefits  of it  for 4-8 weeks. Most people who are treated for depression need to be on medicine for at least 6-12 months after they feel better. If you are taking medicines as part of your treatment, do not stop taking medicines without first talking to your health care provider. You may need to have the medicine slowly decreased (tapered) over time to decrease the risk of harmful side effects. Relationships Your health care provider may suggest family therapy along with individual therapy and drug therapy. While there may not be family problems that are causing you to feel depressed, it is still important to make sure your family learns as much as they can about your mental health. Having your familys support can help make your treatment successful. How to recognize changes in your condition Everyone has a different response to treatment for depression. Recovery from major depression happens when you have not had signs of major depression for two months. This may mean that you will start to:  Have more interest in doing activities.  Feel less hopeless than you did 2 months ago.  Have more energy.  Overeat less often, or have better or improving appetite.  Have better concentration. Your health care provider will work with you to decide the next steps in your recovery. It is also important to recognize when your condition is getting worse. Watch for these signs:  Having fatigue or low energy.  Eating too much or too little.  Sleeping too much or too little.  Feeling restless, agitated, or hopeless.  Having trouble concentrating or making decisions.  Having unexplained physical complaints.  Feeling irritable, angry, or aggressive. Get help as soon as you or your family members notice these symptoms coming back. How to get support and help from others How to talk with friends and family members about your condition  Talking to friends and family members about your condition can provide you  with one way to get support and guidance. Reach out to trusted friends or family members, explain your symptoms to them, and let them know that you are working with a health care provider to treat your depression. Financial resources Not all insurance plans cover mental health care, so it is important to check with your insurance carrier. If paying for co-pays or counseling services is a problem, search for a local or county mental health care center. They may be able to offer public mental health care services at low or no cost when you are not able to see a private health care provider. If you are taking medicine for depression, you may be able to get the generic form, which may be less expensive. Some makers of prescription medicines also offer help to patients who cannot afford the medicines they need. Follow these instructions at home:   Get the right amount and quality of sleep.  Cut down on using caffeine, tobacco, alcohol, and other potentially harmful substances.  Try to exercise, such as walking or lifting small weights.  Take over-the-counter and prescription medicines only as told by your health care provider.  Eat a healthy diet that includes plenty of vegetables, fruits, whole grains, low-fat dairy products, and lean protein. Do not eat a lot of foods that are high in solid fats, added sugars, or salt.  Keep all follow-up visits as told by your health care provider. This is important. Contact a health care provider if:  You stop taking your antidepressant medicines, and you have any of these symptoms: ? Nausea. ? Headache. ? Feeling  lightheaded. ? Chills and body aches. ? Not being able to sleep (insomnia).  You or your friends and family think your depression is getting worse. Get help right away if:  You have thoughts of hurting yourself or others. If you ever feel like you may hurt yourself or others, or have thoughts about taking your own life, get help right away.  You can go to your nearest emergency department or call:  Your local emergency services (911 in the [place]  A suicide crisis helpline, such as the National Suicide Prevention Lifeline at 262-845-01211-812-187-8608. This is open 24-hours a day. Summary  If you are living with depression, there are ways to help you recover from it and also ways to prevent it from coming back.  Work with your health care team to create a management plan that includes counseling, stress management techniques, and healthy lifestyle habits. This information is not intended to replace advice given to you by your health care provider. Make sure you discuss any questions you have with your health care provider. Document Revised: 10/19/2018 Document Reviewed: 05/30/2016 Elsevier Patient Education  2020 [employer]

## 2020-02-11 NOTE — Progress Notes (Signed)
Lexapro was prescibed for one inpatient dose but client has been discharged.  Lexapro 5 mg ordered for the next 2 weeks to her pharmacy.  Nolene Bernheim, RN, MSN, NP-BC Nurse Practitioner, [facility] for [employer], [facility] 02/11/2020 3:08 PM

## 2020-02-12 LAB — RH IG WORKUP (INCLUDES ABO/RH)
ABO/RH(D): O NEG
Antibody Screen: NEGATIVE
Gestational Age(Wks): 17.4
Unit division: 0

## 2020-02-12 LAB — GC/CHLAMYDIA PROBE AMP (~~LOC~~) NOT AT ARMC
Chlamydia: NEGATIVE
Comment: NEGATIVE
Comment: NORMAL
Neisseria Gonorrhea: NEGATIVE

## 2020-02-18 ENCOUNTER — Ambulatory Visit (HOSPITAL_COMMUNITY): Payer: Self-pay | Admitting: Licensed Clinical Social Worker

## 2020-02-18 ENCOUNTER — Other Ambulatory Visit: Payer: Self-pay

## 2020-03-03 ENCOUNTER — Other Ambulatory Visit: Payer: Self-pay

## 2020-03-03 ENCOUNTER — Telehealth (HOSPITAL_COMMUNITY): Payer: Self-pay | Admitting: Psychiatry

## 2020-06-10 ENCOUNTER — Other Ambulatory Visit: Payer: Self-pay | Admitting: Obstetrics and Gynecology

## 2020-06-16 ENCOUNTER — Other Ambulatory Visit: Payer: Self-pay

## 2020-06-16 ENCOUNTER — Inpatient Hospital Stay (HOSPITAL_COMMUNITY)
Admission: AD | Admit: 2020-06-16 | Discharge: 2020-06-16 | Disposition: A | Discharge status: Home / Self Care | Payer: Self-pay | Attending: Obstetrics and Gynecology | Admitting: Obstetrics and Gynecology

## 2020-06-16 ENCOUNTER — Encounter (HOSPITAL_COMMUNITY): Payer: Self-pay | Admitting: Obstetrics and Gynecology

## 2020-06-16 DIAGNOSIS — Z3689 Encounter for other specified antenatal screening: Secondary | ICD-10-CM

## 2020-06-16 DIAGNOSIS — O479 False labor, unspecified: Secondary | ICD-10-CM

## 2020-06-16 DIAGNOSIS — O4703 False labor before 37 completed weeks of gestation, third trimester: Secondary | ICD-10-CM | POA: Insufficient documentation

## 2020-06-16 DIAGNOSIS — Z3A35 35 weeks gestation of pregnancy: Secondary | ICD-10-CM | POA: Insufficient documentation

## 2020-06-16 LAB — URINALYSIS, ROUTINE W REFLEX MICROSCOPIC
Bilirubin Urine: NEGATIVE
Glucose, UA: NEGATIVE mg/dL
Hgb urine dipstick: NEGATIVE
Ketones, ur: NEGATIVE mg/dL
Leukocytes,Ua: NEGATIVE
Nitrite: NEGATIVE
Protein, ur: NEGATIVE mg/dL
Specific Gravity, Urine: 1.006 (ref 1.005–1.030)
pH: 6 (ref 5.0–8.0)

## 2020-06-16 NOTE — MAU Provider Note (Addendum)
Provider first contact with patient at 81  S Ms. Gladis Soley is a 24 y.o. G34P1001 pregnant female who presents to MAU today with complaint of q3-26min painful contractions since this evening at a work party. She is feeling them in her lower abdomen as tight and sharp. She is scheduled for a repeat Cesarean and expressed anxiety about being in labor and having to deliver vaginally.   O BP 133/68 (BP Location: Right Arm)   Pulse 94   Temp 98.2 F (36.8 C) (Oral)   Resp 20   LMP  (LMP Unknown)   SpO2 98%  Physical Exam Vitals and nursing note reviewed.  Constitutional:      General: She is not in acute distress.    Appearance: Normal appearance. She is normal weight. She is not ill-appearing.  HENT:     Mouth/Throat:     Mouth: Mucous membranes are moist.  Eyes:     Pupils: Pupils are equal, round, and reactive to light.  Cardiovascular:     Rate and Rhythm: Normal rate.     Pulses: Normal pulses.  Pulmonary:     Effort: Pulmonary effort is normal.  Abdominal:     Palpations: Abdomen is soft.  Genitourinary:    General: Normal vulva.     Comments: Dilation: Fingertip Effacement (%): Thick Cervical Position: Posterior Exam by:: Tyler Aas, CNM  Musculoskeletal:        General: Normal range of motion.  Skin:    General: Skin is warm and dry.     Capillary Refill: Capillary refill takes less than 2 seconds.  Neurological:     Mental Status: She is alert and oriented to person, place, and time.  Psychiatric:        Mood and Affect: Mood normal.        Behavior: Behavior normal.        Thought Content: Thought content normal.        Judgment: Judgment normal.    Will push oral hydration and monitor for two hours before rechecking her cervix. Gave reassurance that baby is not engaged in the pelvis, pt expressed understanding and relief. Care turned over to Steward Drone, CNM for recheck at 10pm Edd Arbour, CNM, MSN, [facility] 06/16/20 9:21 PM   Fetal monitoring:   140/moderate/+accels/ no decelerations  Irregular mild contractions   Cervix rechecked @2226  - no cervical change  Patient reports that contractions have spaced out and not as strong in intensity  Discussed reasons to return to MAU. Labor precautions reviewed with patient Follow up as scheduled in the office. Return to MAU as needed. Pt stable at time of discharge.   A 1. Braxton Hicks contractions   2. [redacted] weeks gestation of pregnancy   3. NST (non-stress test) reactive     P Discharge home Follow up as scheduled in the office for prenatal care Return to MAU as needed for reasons discussed and/or emergencies  Labor precautions  NST reactive and reassuring    , CNM 06/16/20, 11:42 PM

## 2020-06-16 NOTE — MAU Note (Signed)
Pt c/o CTX beginning around 17:45 pm this evening, coming every 2-3 min lasting 1.5-2 min per pt.  Endorses +FM, denies any vaginal bleeding or discharge.

## 2020-06-19 ENCOUNTER — Encounter (HOSPITAL_COMMUNITY): Payer: Self-pay | Admitting: Obstetrics and Gynecology

## 2020-06-19 ENCOUNTER — Inpatient Hospital Stay (HOSPITAL_COMMUNITY)
Admission: AD | Admit: 2020-06-19 | Discharge: 2020-06-19 | Disposition: A | Discharge status: Home / Self Care | Payer: Self-pay | Attending: Obstetrics and Gynecology | Admitting: Obstetrics and Gynecology

## 2020-06-19 ENCOUNTER — Other Ambulatory Visit: Payer: Self-pay

## 2020-06-19 DIAGNOSIS — Z8759 Personal history of other complications of pregnancy, childbirth and the puerperium: Secondary | ICD-10-CM | POA: Insufficient documentation

## 2020-06-19 DIAGNOSIS — Z3A36 36 weeks gestation of pregnancy: Secondary | ICD-10-CM | POA: Insufficient documentation

## 2020-06-19 DIAGNOSIS — O2313 Infections of bladder in pregnancy, third trimester: Secondary | ICD-10-CM | POA: Insufficient documentation

## 2020-06-19 DIAGNOSIS — N3001 Acute cystitis with hematuria: Secondary | ICD-10-CM | POA: Insufficient documentation

## 2020-06-19 DIAGNOSIS — Z20822 Contact with and (suspected) exposure to covid-19: Secondary | ICD-10-CM | POA: Insufficient documentation

## 2020-06-19 DIAGNOSIS — Z880 Allergy status to penicillin: Secondary | ICD-10-CM | POA: Insufficient documentation

## 2020-06-19 DIAGNOSIS — O99891 Other specified diseases and conditions complicating pregnancy: Secondary | ICD-10-CM

## 2020-06-19 DIAGNOSIS — O99613 Diseases of the digestive system complicating pregnancy, third trimester: Secondary | ICD-10-CM | POA: Insufficient documentation

## 2020-06-19 DIAGNOSIS — O26893 Other specified pregnancy related conditions, third trimester: Secondary | ICD-10-CM | POA: Insufficient documentation

## 2020-06-19 DIAGNOSIS — R079 Chest pain, unspecified: Secondary | ICD-10-CM | POA: Insufficient documentation

## 2020-06-19 DIAGNOSIS — O212 Late vomiting of pregnancy: Secondary | ICD-10-CM | POA: Insufficient documentation

## 2020-06-19 DIAGNOSIS — R197 Diarrhea, unspecified: Secondary | ICD-10-CM | POA: Insufficient documentation

## 2020-06-19 LAB — WET PREP, GENITAL
Clue Cells Wet Prep HPF POC: NONE SEEN
Sperm: NONE SEEN
Trich, Wet Prep: NONE SEEN
Yeast Wet Prep HPF POC: NONE SEEN

## 2020-06-19 LAB — AMNISURE RUPTURE OF MEMBRANE (ROM) NOT AT ARMC: Amnisure ROM: NEGATIVE

## 2020-06-19 LAB — URINALYSIS, ROUTINE W REFLEX MICROSCOPIC
Bilirubin Urine: NEGATIVE
Glucose, UA: NEGATIVE mg/dL
Hgb urine dipstick: NEGATIVE
Ketones, ur: NEGATIVE mg/dL
Leukocytes,Ua: NEGATIVE
Nitrite: POSITIVE — AB
Protein, ur: NEGATIVE mg/dL
Specific Gravity, Urine: 1.005 (ref 1.005–1.030)
pH: 6 (ref 5.0–8.0)

## 2020-06-19 LAB — RESP PANEL BY RT-PCR (FLU A&B, COVID) ARPGX2
Influenza A by PCR: NEGATIVE
Influenza B by PCR: NEGATIVE
SARS Coronavirus 2 by RT PCR: NEGATIVE

## 2020-06-19 MED ORDER — ONDANSETRON 8 MG PO TBDP: 8.0000 mg | ORAL_TABLET | Freq: Three times a day (TID) | ORAL | 0 refills | Status: DC | PRN

## 2020-06-19 MED ORDER — CEFADROXIL 500 MG PO CAPS: 500.0000 mg | ORAL_CAPSULE | Freq: Two times a day (BID) | ORAL | 0 refills | Status: DC

## 2020-06-19 NOTE — Discharge Instructions (Signed)
Pregnancy and Urinary Tract Infection ° °A urinary tract infection (UTI) is an infection of any part of the urinary tract. This includes the kidneys, the tubes that connect your kidneys to your bladder (ureters), the bladder, and the tube that carries urine out of your body (urethra). These organs make, store, and get rid of urine in the body. Your health care provider may use other names to describe the infection. An upper UTI affects the ureters and kidneys (pyelonephritis). A lower UTI affects the bladder (cystitis) and urethra (urethritis). °Most urinary tract infections are caused by bacteria in your genital area, around the entrance to your urinary tract (urethra). These bacteria grow and cause irritation and inflammation of your urinary tract. You are more likely to develop a UTI during pregnancy because the physical and hormonal changes your body goes through can make it easier for bacteria to get into your urinary tract. Your growing baby also puts pressure on your bladder and can affect urine flow. It is important to recognize and treat UTIs in pregnancy because of the risk of serious complications for both you and your baby. °How does this affect me? °Symptoms of a UTI include: °· Needing to urinate right away (urgently). °· Frequent urination or passing small amounts of urine frequently. °· Pain or burning with urination. °· Blood in the urine. °· Urine that smells bad or unusual. °· Trouble urinating. °· Cloudy urine. °· Pain in the abdomen or lower back. °· Vaginal discharge. °You may also have: °· Vomiting or a decreased appetite. °· Confusion. °· Irritability or tiredness. °· A fever. °· Diarrhea. °How does this affect my baby? °An untreated UTI during pregnancy could lead to a kidney infection or a systemic infection, which can cause health problems that could affect your baby. Possible complications of an untreated UTI include: °· Giving birth to your baby before 37 weeks of pregnancy  (premature). °· Having a baby with a low birth weight. °· Developing high blood pressure during pregnancy (preeclampsia). °· Having a low hemoglobin level (anemia). °What can I do to lower my risk? °To prevent a UTI: °· Go to the bathroom as soon as you feel the need. Do not hold urine for long periods of time. °· Always wipe from front to back, especially after a bowel movement. Use each tissue one time when you wipe. °· Empty your bladder after sex. °· Keep your genital area dry. °· Drink 6-10 glasses of water each day. °· Do not douche or use deodorant sprays. °How is this treated? °Treatment for this condition may include: °· Antibiotic medicines that are safe to take during pregnancy. °· Other medicines to treat less common causes of UTI. °Follow these instructions at home: °· If you were prescribed an antibiotic medicine, take it as told by your health care provider. Do not stop using the antibiotic even if you start to feel better. °· Keep all follow-up visits as told by your health care provider. This is important. °Contact a health care provider if: °· Your symptoms do not improve or they get worse. °· You have abnormal vaginal discharge. °Get help right away if you: °· Have a fever. °· Have nausea and vomiting. °· Have back or side pain. °· Feel contractions in your uterus. °· Have lower belly pain. °· Have a gush of fluid from your vagina. °· Have blood in your urine. °Summary °· A urinary tract infection (UTI) is an infection of any part of the urinary tract, which includes the   kidneys, ureters, bladder, and urethra. °· Most urinary tract infections are caused by bacteria in your genital area, around the entrance to your urinary tract (urethra). °· You are more likely to develop a UTI during pregnancy. °· If you were prescribed an antibiotic medicine, take it as told by your health care provider. Do not stop using the antibiotic even if you start to feel better. °This information is not intended to  replace advice given to you by your health care provider. Make sure you discuss any questions you have with your health care provider. °Document Revised: 10/19/2018 Document Reviewed: 05/31/2018 °Elsevier Patient Education © 2020 [employer] ° °

## 2020-06-19 NOTE — MAU Provider Note (Addendum)
Patient Christine Osborn is a 24 y.o.  G2P1001  at [redacted]w[redacted]d here with multiple complaints, including chest pain that started at 12:15, LOF since Wednesday, contractions since Tuesday. She also had diarrhea early this morning, and then vomited at lunchtime today.   She denies fever, SOB, muscle aches, HA.   She is most concerned about her pelvic pain which feels like shooting pains into her vagina.   She is scheduled for a repeat c/section; prior c/section was for breech and oligo with her son; reports that she had a slow leak with her son and then was found to have oligo.   Patient states that she just "wants this to be over" and is asking if her c/section date can be moved up.   History     CSN: [id number]  Arrival date and time: 06/19/20 1339   Event Date/Time   First Provider Initiated Contact with Patient 06/19/20 1428      Chief Complaint  Patient presents with  . Emesis  . Diarrhea  . Contractions  . Chest Pain   Emesis  This is a new problem. The current episode started today. The problem occurs 2 to 4 times per day. The problem has been unchanged. The emesis has an appearance of stomach contents and bile. There has been no fever. Associated symptoms include chest pain and diarrhea.  Diarrhea  This is a new problem. The current episode started today. The problem occurs 5 to 10 times per day. Associated symptoms include vomiting.  Chest Pain  This is a new problem. The current episode started today. The problem occurs intermittently. The pain is at a severity of 8/10. Quality: feels like something is sitting on her chest. Associated symptoms include vomiting.   She reports a clear fluid that continues to run out of her vagina. It has no odor or color. She reports that she had a "slow leak" at 32 weeks with her son.  OB History    Gravida  2   Para  1   Term  1   Preterm      AB      Living  1     SAB      IAB      Ectopic      Multiple      Live Births  1            Past Medical History:  Diagnosis Date  . Depression   . Morning sickness 03/14/2017    Past Surgical History:  Procedure Laterality Date  . CESAREAN SECTION N/A 09/15/2017   Procedure: CESAREAN SECTION;  Surgeon: Carrington Clamp, MD;  Location: [facility];  Service: Obstetrics;  Laterality: N/A;  . OTHER SURGICAL HISTORY     Pt required second spinal epidural    Family History  Problem Relation Age of Onset  . Hypertension Mother   . Hypertension Father     Social History   Tobacco Use  . Smoking status: Never Smoker  . Smokeless tobacco: Never Used  Vaping Use  . Vaping Use: Never used  Substance Use Topics  . Alcohol use: No  . Drug use: No    Allergies:  Allergies  Allergen Reactions  . Penicillins Hives    Has patient had a PCN reaction causing immediate rash, facial/tongue/throat swelling, SOB or lightheadedness with hypotension: no Has patient had a PCN reaction causing severe rash involving mucus membranes or skin necrosis: no Has patient had a PCN reaction that required hospitalization:  unknown Has patient had a PCN reaction occurring within the last 10 years: no If all of the above answers are "NO", then may proceed with Cephalosporin use.     Medications Prior to Admission  Medication Sig Dispense Refill Last Dose  . escitalopram (LEXAPRO) 5 MG tablet Take 1 tablet (5 mg total) by mouth daily for 15 days. 15 tablet 0   . ferrous sulfate 325 (65 FE) MG tablet Take 325 mg by mouth daily with breakfast.     . Prenatal Vit-Fe Fumarate-FA (PRENATAL MULTIVITAMIN) TABS tablet Take 1 tablet by mouth daily at 12 noon.       Review of Systems  HENT: Negative.   Cardiovascular: Positive for chest pain.  Gastrointestinal: Positive for diarrhea and vomiting.  Genitourinary: Negative.   Musculoskeletal: Negative.   Allergic/Immunologic: Negative.   Neurological: Negative.   Psychiatric/Behavioral: Negative.    Physical Exam   Blood  pressure 123/70, pulse 88, temperature 98.2 F (36.8 C), temperature source Oral, resp. rate 18, height 5\' 1"  (1.549 m), weight 72.9 kg, SpO2 100 %, unknown if currently breastfeeding.  Physical Exam Constitutional:      Appearance: She is well-developed.  Abdominal:     Palpations: Abdomen is soft.  Skin:    General: Skin is warm.  Neurological:     Mental Status: She is alert.   NEFG; no pooling, cervix appears visually closed, RN checked patient and she was FT.   MAU Course  Procedures  MDM -EKG NSR -Patient had no episodes of N/V/D while in MAU; was monitored in MAU for several hours.  -Amnisure negative -COVID 19 test negative -NST reassuring: 150 bpm, mod var, present acel, no decels, uterine irratability -UA shows nitrites, will send urine for culture and start abx. Confirmed with Dr. that patient may have cephalosporin despite Hives allergy to PCN.  -Patient VSS were normal while in MAU and she had no CVA tenderness; lungs were clear to auscultation bilaterally. Chest xray and CBC not done.    Patient Vitals for the past 24 hrs:  BP Temp Temp src Pulse Resp SpO2 Height Weight  06/19/20 1729 (!) 126/58 -- -- 85 16 100 % -- --  06/19/20 1401 123/70 98.2 F (36.8 C) Oral 88 18 100 % -- --  06/19/20 1349 -- -- -- -- -- -- 5\' 1"  (1.549 m) 72.9 kg    Assessment and Plan   1. Acute cystitis with hematuria    2. Patient will pick up cefadroxil and zofran at the pharmacy 3. Keep OB appt on Tuesday; talk about c/section scheduling with her MD 4. Strict return precautions given, especially for fever, muscle aches, back pain. These all may be signs of pyelenephritis and she would need inpatient admission for abx. Also reviewed third trimester precautions, bleeding, LOF, decreased fetal movements.  5. Patient should return to MAU if any signs of labor as she is scheduled c/section    Brean Carberry 06/19/2020, 2:35 PM

## 2020-06-19 NOTE — MAU Note (Addendum)
Presents with c/o N/V/D, states began this morning @ 1145.  Reports had 2 bites of lunch and has been sick since, states emesis x4.  Reports stool is loose, but has form, and has gone " a lot".  States also having chest pain in center of chest, states "feels like elephant sitting on chest" .States was here 2 days ago and cervix "half centimeter" and currently having ctxs 10-15 minutes apart.  Denies VB and reports LOF since Wednesday.  States informed office and "they didn;t seem to concerned about it".  Endorses +FM.

## 2020-06-22 LAB — GC/CHLAMYDIA PROBE AMP (~~LOC~~) NOT AT ARMC
Chlamydia: NEGATIVE
Comment: NEGATIVE
Comment: NORMAL
Neisseria Gonorrhea: NEGATIVE

## 2020-07-01 ENCOUNTER — Encounter (HOSPITAL_COMMUNITY): Payer: Self-pay

## 2020-07-01 NOTE — Patient Instructions (Signed)
Christine Osborn  07/01/2020   Your procedure is scheduled on:  07/12/2020  Arrive at 0500 at [employer] [facility] on [address] at [facility]  and [employer]. You are invited to use the FREE valet parking or use the Visitor's parking deck.  Pick up the phone at the desk and dial [phone].  Call this number if you have problems the morning of surgery: [phone]  Remember:   Do not eat food:(After Midnight) Desps de medianoche.  Do not drink clear liquids: (After Midnight) Desps de medianoche.  Take these medicines the morning of surgery with A SIP OF WATER:  If yout take lexapro in the morning, take it as usual.  No other medications.   Do not wear jewelry, make-up or nail polish.  Do not wear lotions, powders, or perfumes. Do not wear deodorant.  Do not shave 48 hours prior to surgery.  Do not bring valuables to the hospital.  [facility] is not   responsible for any belongings or valuables brought to the hospital.  Contacts, dentures or bridgework may not be worn into surgery.  Leave suitcase in the car. After surgery it may be brought to your room.  For patients admitted to the hospital, checkout time is 11:00 AM the day of              discharge.      Please read over the following fact sheets that you were given:     Preparing for Surgery

## 2020-07-07 ENCOUNTER — Other Ambulatory Visit: Payer: Self-pay

## 2020-07-07 ENCOUNTER — Encounter (HOSPITAL_COMMUNITY): Payer: Self-pay | Admitting: Obstetrics & Gynecology

## 2020-07-07 ENCOUNTER — Inpatient Hospital Stay (HOSPITAL_COMMUNITY)
Admission: AD | Admit: 2020-07-07 | Discharge: 2020-07-07 | Disposition: A | Discharge status: Home / Self Care | Payer: Self-pay | Attending: Obstetrics & Gynecology | Admitting: Obstetrics & Gynecology

## 2020-07-07 DIAGNOSIS — O479 False labor, unspecified: Secondary | ICD-10-CM

## 2020-07-07 DIAGNOSIS — Z3A38 38 weeks gestation of pregnancy: Secondary | ICD-10-CM

## 2020-07-07 DIAGNOSIS — O471 False labor at or after 37 completed weeks of gestation: Secondary | ICD-10-CM

## 2020-07-07 MED ORDER — PROMETHAZINE HCL 25 MG/ML IJ SOLN
25.0000 mg | Freq: Once | INTRAMUSCULAR | Status: AC
  Administered 2020-07-07: 25 mg via INTRAMUSCULAR
  Filled 2020-07-07: qty 1

## 2020-07-07 MED ORDER — BUTORPHANOL TARTRATE 1 MG/ML IJ SOLN
1.0000 mg | Freq: Once | INTRAMUSCULAR | Status: AC
Start: 2020-07-07 — End: 2020-07-07
  Administered 2020-07-07: 1 mg via INTRAMUSCULAR
  Filled 2020-07-07: qty 1

## 2020-07-07 NOTE — MAU Provider Note (Signed)
Event Date/Time   First Provider Initiated Contact with Patient 07/07/20 1516     S: Ms. Christine Osborn is a 24 y.o. G2P1001 at [redacted]w[redacted]d  who presents to MAU today complaining contractions q 4-5 minutes. Pain score 10/10. She denies vaginal bleeding. She denies LOF. She reports normal fetal movement.    She receives care with [facility] and is scheduled for repeat cesarean this weekend  O: BP 123/70 (BP Location: Right Arm)   Pulse 82   Temp 98.4 F (36.9 C) (Oral)   Resp 18   Ht 5\' 1"  (1.549 m)   Wt 75.4 kg   LMP  (LMP Unknown)   SpO2 99%   BMI 31.40 kg/m  GENERAL: Well-developed, well-nourished female in no acute distress.  HEAD: Normocephalic, atraumatic.  CHEST: Normal effort of breathing, regular heart rate ABDOMEN: Soft, nontender, gravid  Cervical exam:  Dilation: Fingertip Effacement (%): Thick Cervical Position: Anterior,Middle Presentation: Undeterminable Exam by:: [email or web address], RN   Fetal Monitoring: Baseline: 125 Variability: Mod Accelerations: Irregular Decelerations: None Contractions: Irregular q 2-9   A: SIUP at [redacted]w[redacted]d  Patient requesting therapeutic rest, orders placed Very active fetal movement and uncertain baseline in early portion of tracing, reviewed with Dr. [redacted]w[redacted]d Cervix remains FT 4 hours after original exam, 90 minutes after medication  P: Discharge home in stable condition  Shawnie Pons, CNM 07/07/2020 9:12 PM

## 2020-07-07 NOTE — MAU Note (Addendum)
Discharge instructions given including to return to MAU for contractions increasing intensity and frequency, ROM, VB, or decreased fetal movement. Keep f/u appointment with MD. 1740- discharged via [facility] because patient still slightly unsteady from medication.

## 2020-07-07 NOTE — MAU Note (Signed)
Patient stated that she started contracting at 0900 and progressively stronger and closer together, denies vaginal bleeding or LOF, states decreased fetal movement this morning.

## 2020-07-07 NOTE — MAU Note (Signed)
Having contractions every 4-5 min, going into her back, feeling a lot of pressure between her legs. No recent exams. No bleeding or leaking.

## 2020-07-07 NOTE — Discharge Instructions (Signed)
Braxton Hicks Contractions °Contractions of the uterus can occur throughout pregnancy, but they are not always a sign that you are in labor. You may have practice contractions called Braxton Hicks contractions. These false labor contractions are sometimes confused with true labor. °What are Braxton Hicks contractions? °Braxton Hicks contractions are tightening movements that occur in the muscles of the uterus before labor. Unlike true labor contractions, these contractions do not result in opening (dilation) and thinning of the cervix. Toward the end of pregnancy (32-34 weeks), Braxton Hicks contractions can happen more often and may become stronger. These contractions are sometimes difficult to tell apart from true labor because they can be very uncomfortable. You should not feel embarrassed if you go to the hospital with false labor. °Sometimes, the only way to tell if you are in true labor is for your health care provider to look for changes in the cervix. The health care provider will do a physical exam and may monitor your contractions. If you are not in true labor, the exam should show that your cervix is not dilating and your water has not broken. °If there are no other health problems associated with your pregnancy, it is completely safe for you to be sent home with false labor. You may continue to have Braxton Hicks contractions until you go into true labor. °How to tell the difference between true labor and false labor °True labor °· Contractions last 30-70 seconds. °· Contractions become very regular. °· Discomfort is usually felt in the top of the uterus, and it spreads to the lower abdomen and low back. °· Contractions do not go away with walking. °· Contractions usually become more intense and increase in frequency. °· The cervix dilates and gets thinner. °False labor °· Contractions are usually shorter and not as strong as true labor contractions. °· Contractions are usually irregular. °· Contractions  are often felt in the front of the lower abdomen and in the groin. °· Contractions may go away when you walk around or change positions while lying down. °· Contractions get weaker and are shorter-lasting as time goes on. °· The cervix usually does not dilate or become thin. °Follow these instructions at home: ° °· Take over-the-counter and prescription medicines only as told by your health care provider. °· Keep up with your usual exercises and follow other instructions from your health care provider. °· Eat and drink lightly if you think you are going into labor. °· If Braxton Hicks contractions are making you uncomfortable: °? Change your position from lying down or resting to walking, or change from walking to resting. °? Sit and rest in a tub of warm water. °? Drink enough fluid to keep your urine pale yellow. Dehydration may cause these contractions. °? Do slow and deep breathing several times an hour. °· Keep all follow-up prenatal visits as told by your health care provider. This is important. °Contact a health care provider if: °· You have a fever. °· You have continuous pain in your abdomen. °Get help right away if: °· Your contractions become stronger, more regular, and closer together. °· You have fluid leaking or gushing from your vagina. °· You pass blood-tinged mucus (bloody show). °· You have bleeding from your vagina. °· You have low back pain that you never had before. °· You feel your baby’s head pushing down and causing pelvic pressure. °· Your baby is not moving inside you as much as it used to. °Summary °· Contractions that occur before labor are   called Braxton Hicks contractions, false labor, or practice contractions. °· Braxton Hicks contractions are usually shorter, weaker, farther apart, and less regular than true labor contractions. True labor contractions usually become progressively stronger and regular, and they become more frequent. °· Manage discomfort from Braxton Hicks contractions  by changing position, resting in a warm bath, drinking plenty of water, or practicing deep breathing. °This information is not intended to replace advice given to you by your health care provider. Make sure you discuss any questions you have with your health care provider. °Document Revised: 06/09/2017 Document Reviewed: 11/10/2016 °Elsevier Patient Education © 2020 [employer] Inc. ° °

## 2020-07-09 ENCOUNTER — Ambulatory Visit (HOSPITAL_COMMUNITY)
Admission: RE | Admit: 2020-07-09 | Discharge: 2020-07-09 | Disposition: A | Discharge status: Home / Self Care | Payer: Self-pay | Source: Ambulatory Visit | Attending: Internal Medicine | Admitting: Internal Medicine

## 2020-07-09 ENCOUNTER — Encounter (HOSPITAL_COMMUNITY): Payer: Self-pay | Admitting: Obstetrics & Gynecology

## 2020-07-09 ENCOUNTER — Other Ambulatory Visit: Payer: Self-pay

## 2020-07-09 ENCOUNTER — Other Ambulatory Visit (HOSPITAL_COMMUNITY): Payer: Self-pay

## 2020-07-09 ENCOUNTER — Encounter (HOSPITAL_COMMUNITY)
Admission: RE | Admit: 2020-07-09 | Discharge: 2020-07-09 | Disposition: A | Discharge status: Home / Self Care | Payer: Self-pay | Source: Ambulatory Visit | Attending: Obstetrics and Gynecology | Admitting: Obstetrics and Gynecology

## 2020-07-09 DIAGNOSIS — Z3A00 Weeks of gestation of pregnancy not specified: Secondary | ICD-10-CM | POA: Insufficient documentation

## 2020-07-09 DIAGNOSIS — D509 Iron deficiency anemia, unspecified: Secondary | ICD-10-CM | POA: Insufficient documentation

## 2020-07-09 DIAGNOSIS — Z01812 Encounter for preprocedural laboratory examination: Secondary | ICD-10-CM | POA: Insufficient documentation

## 2020-07-09 DIAGNOSIS — O99019 Anemia complicating pregnancy, unspecified trimester: Secondary | ICD-10-CM | POA: Insufficient documentation

## 2020-07-09 LAB — CBC
HCT: 29.2 % — ABNORMAL LOW (ref 36.0–46.0)
Hemoglobin: 9.2 g/dL — ABNORMAL LOW (ref 12.0–15.0)
MCH: 25.4 pg — ABNORMAL LOW (ref 26.0–34.0)
MCHC: 31.5 g/dL (ref 30.0–36.0)
MCV: 80.7 fL (ref 80.0–100.0)
Platelets: 136 10*3/uL — ABNORMAL LOW (ref 150–400)
RBC: 3.62 MIL/uL — ABNORMAL LOW (ref 3.87–5.11)
RDW: 15.8 % — ABNORMAL HIGH (ref 11.5–15.5)
WBC: 8 10*3/uL (ref 4.0–10.5)
nRBC: 0 % (ref 0.0–0.2)

## 2020-07-09 LAB — TYPE AND SCREEN
ABO/RH(D): O NEG
Antibody Screen: NEGATIVE

## 2020-07-09 MED ORDER — SODIUM CHLORIDE 0.9 % IV SOLN
INTRAVENOUS | Status: DC | PRN
  Administered 2020-07-09: 250 mL via INTRAVENOUS

## 2020-07-09 MED ORDER — SODIUM CHLORIDE 0.9 % IV SOLN
510.0000 mg | Freq: Once | INTRAVENOUS | Status: AC
  Administered 2020-07-09: 510 mg via INTRAVENOUS
  Filled 2020-07-09: qty 510

## 2020-07-09 NOTE — Discharge Instructions (Signed)

## 2020-07-09 NOTE — Progress Notes (Signed)
PATIENT CARE CENTER NOTE  Diagnosis: Iron Deficiency Anemia    Provider: Janey Greaser, MD   Procedure: Feraheme infusion    Note: Patient received Feraheme infusion via PIV. Tolerated well with no adverse reaction. Observed patient for 30 minutes post-infusion. Vital signs stable. Discharge instructions given. Patient alert, oriented and ambulatory at discharge.

## 2020-07-10 LAB — RPR: RPR Ser Ql: NONREACTIVE

## 2020-07-11 NOTE — H&P (Signed)
25 y.o.  G2P1001 [redacted]w[redacted]d comes in for a repeat cesarean section at term.  Patient has good fetal movement and no bleeding.  Pt also desires sterilization.   Past Medical History:  Diagnosis Date  . Depression   . Morning sickness 03/14/2017    Past Surgical History:  Procedure Laterality Date  . CESAREAN SECTION N/A 09/15/2017   Procedure: CESAREAN SECTION;  Surgeon: Carrington Clamp, MD;  Location: [facility] BIRTHING SUITES;  Service: Obstetrics;  Laterality: N/A;  . OTHER SURGICAL HISTORY     Pt required second spinal epidural    OB History  Gravida Para Term Preterm AB Living  2 1 1     1   SAB IAB Ectopic Multiple Live Births          1    # Outcome Date GA Lbr Len/2nd Weight Sex Delivery Anes PTL Lv  2 Current           1 Term 09/15/17 [redacted]w[redacted]d  3365 g M CS-LTranv Spinal  LIV    Social History   Socioeconomic History  . Marital status: Married    Spouse name: Not on file  . Number of children: Not on file  . Years of education: Not on file  . Highest education level: Not on file  Occupational History  . Not on file  Tobacco Use  . Smoking status: Never Smoker  . Smokeless tobacco: Never Used  Vaping Use  . Vaping Use: Never used  Substance and Sexual Activity  . Alcohol use: No  . Drug use: No  . Sexual activity: Yes  Other Topics Concern  . Not on file  Social History Narrative  . Not on file   Social Determinants of Health   Financial Resource Strain: Not on file  Food Insecurity: Not on file  Transportation Needs: Not on file  Physical Activity: Not on file  Stress: Not on file  Social Connections: Not on file  Intimate Partner Violence: Not on file   Penicillins   Prenatal Course: uncomplicated   Prenatal Transfer Tool  Maternal Diabetes: No Genetic Screening: Normal Maternal Ultrasounds/Referrals: Normal Fetal Ultrasounds or other Referrals:  None Maternal Substance Abuse:  No Significant Maternal Medications:  Meds include: Other: IV iron Significant  Maternal Lab Results: Group B Strep negative  There were no vitals filed for this visit.  Lungs/Cor:  NAD Abdomen:  soft, gravid Ex:  no cords, erythema SVE:  NA FHTs:  present  A/P   For repeat cesarean sectionat term wit bilateral tubal ligation.  All risks, benefits and alternatives discussed with patient and she desires to proceed.  [redacted]w[redacted]d

## 2020-07-12 ENCOUNTER — Inpatient Hospital Stay (HOSPITAL_COMMUNITY)
Admission: RE | Admit: 2020-07-12 | Discharge: 2020-07-14 | DRG: 785 | Disposition: A | Discharge status: Home / Self Care | Payer: Self-pay | Attending: Obstetrics and Gynecology | Admitting: Obstetrics and Gynecology

## 2020-07-12 ENCOUNTER — Encounter (HOSPITAL_COMMUNITY): Payer: Self-pay | Admitting: Obstetrics and Gynecology

## 2020-07-12 ENCOUNTER — Encounter (HOSPITAL_COMMUNITY)
Admission: RE | Disposition: A | Discharge status: Home / Self Care | Payer: Self-pay | Source: Home / Self Care | Attending: Obstetrics and Gynecology

## 2020-07-12 ENCOUNTER — Inpatient Hospital Stay (HOSPITAL_COMMUNITY): Payer: Self-pay | Admitting: Anesthesiology

## 2020-07-12 ENCOUNTER — Other Ambulatory Visit: Payer: Self-pay

## 2020-07-12 DIAGNOSIS — O9902 Anemia complicating childbirth: Secondary | ICD-10-CM | POA: Diagnosis present

## 2020-07-12 DIAGNOSIS — O26893 Other specified pregnancy related conditions, third trimester: Secondary | ICD-10-CM | POA: Diagnosis present

## 2020-07-12 DIAGNOSIS — O34211 Maternal care for low transverse scar from previous cesarean delivery: Principal | ICD-10-CM | POA: Diagnosis present

## 2020-07-12 DIAGNOSIS — Z3A39 39 weeks gestation of pregnancy: Secondary | ICD-10-CM | POA: Diagnosis not present

## 2020-07-12 DIAGNOSIS — Z20822 Contact with and (suspected) exposure to covid-19: Secondary | ICD-10-CM | POA: Diagnosis present

## 2020-07-12 DIAGNOSIS — Z302 Encounter for sterilization: Secondary | ICD-10-CM

## 2020-07-12 DIAGNOSIS — Z9889 Other specified postprocedural states: Secondary | ICD-10-CM

## 2020-07-12 DIAGNOSIS — D649 Anemia, unspecified: Secondary | ICD-10-CM | POA: Diagnosis present

## 2020-07-12 DIAGNOSIS — Z6791 Unspecified blood type, Rh negative: Secondary | ICD-10-CM

## 2020-07-12 LAB — RESP PANEL BY RT-PCR (FLU A&B, COVID) ARPGX2
Influenza A by PCR: NEGATIVE
Influenza B by PCR: NEGATIVE
SARS Coronavirus 2 by RT PCR: NEGATIVE

## 2020-07-12 SURGERY — Surgical Case
Anesthesia: Spinal

## 2020-07-12 MED ORDER — SOD CITRATE-CITRIC ACID 500-334 MG/5ML PO SOLN
ORAL | Status: AC
  Filled 2020-07-12: qty 30

## 2020-07-12 MED ORDER — SIMETHICONE 80 MG PO CHEW
80.0000 mg | CHEWABLE_TABLET | Freq: Three times a day (TID) | ORAL | Status: DC
  Administered 2020-07-12 – 2020-07-13 (×5): 80 mg via ORAL
  Filled 2020-07-12 (×6): qty 1

## 2020-07-12 MED ORDER — NALOXONE HCL 4 MG/10ML IJ SOLN
1.0000 ug/kg/h | INTRAVENOUS | Status: DC | PRN
  Filled 2020-07-12: qty 5

## 2020-07-12 MED ORDER — COCONUT OIL OIL: 1.0000 "application " | TOPICAL_OIL | Status: DC | PRN

## 2020-07-12 MED ORDER — POVIDONE-IODINE 10 % EX SWAB: 2.0000 "application " | Freq: Once | CUTANEOUS | Status: DC

## 2020-07-12 MED ORDER — METHYLERGONOVINE MALEATE 0.2 MG PO TABS: 0.2000 mg | ORAL_TABLET | ORAL | Status: DC | PRN

## 2020-07-12 MED ORDER — DEXAMETHASONE SODIUM PHOSPHATE 4 MG/ML IJ SOLN
INTRAMUSCULAR | Status: DC | PRN
  Administered 2020-07-12: 10 mg via INTRAVENOUS

## 2020-07-12 MED ORDER — NALOXONE HCL 0.4 MG/ML IJ SOLN: 0.4000 mg | INTRAMUSCULAR | Status: DC | PRN

## 2020-07-12 MED ORDER — FENTANYL CITRATE (PF) 100 MCG/2ML IJ SOLN
INTRAMUSCULAR | Status: AC
  Filled 2020-07-12: qty 2

## 2020-07-12 MED ORDER — ACETAMINOPHEN 500 MG PO TABS
1000.0000 mg | ORAL_TABLET | Freq: Four times a day (QID) | ORAL | Status: AC
  Administered 2020-07-12 (×3): 1000 mg via ORAL
  Filled 2020-07-12 (×3): qty 2

## 2020-07-12 MED ORDER — DIPHENHYDRAMINE HCL 50 MG/ML IJ SOLN: 12.5000 mg | INTRAMUSCULAR | Status: DC | PRN

## 2020-07-12 MED ORDER — WITCH HAZEL-GLYCERIN EX PADS: 1.0000 "application " | MEDICATED_PAD | CUTANEOUS | Status: DC | PRN

## 2020-07-12 MED ORDER — MEASLES, MUMPS & RUBELLA VAC IJ SOLR: 0.5000 mL | Freq: Once | INTRAMUSCULAR | Status: DC

## 2020-07-12 MED ORDER — CLINDAMYCIN PHOSPHATE 900 MG/50ML IV SOLN
900.0000 mg | INTRAVENOUS | Status: AC
  Administered 2020-07-12: 900 mg via INTRAVENOUS

## 2020-07-12 MED ORDER — BISACODYL 10 MG RE SUPP
10.0000 mg | Freq: Every day | RECTAL | Status: DC | PRN
  Administered 2020-07-13: 10 mg via RECTAL
  Filled 2020-07-12: qty 1

## 2020-07-12 MED ORDER — FENTANYL CITRATE (PF) 100 MCG/2ML IJ SOLN
INTRAMUSCULAR | Status: DC | PRN
  Administered 2020-07-12: 15 ug via INTRATHECAL

## 2020-07-12 MED ORDER — OXYTOCIN-SODIUM CHLORIDE 30-0.9 UT/500ML-% IV SOLN
INTRAVENOUS | Status: DC | PRN
  Administered 2020-07-12: 600 mL/h via INTRAVENOUS

## 2020-07-12 MED ORDER — IBUPROFEN 800 MG PO TABS
800.0000 mg | ORAL_TABLET | Freq: Three times a day (TID) | ORAL | Status: DC
  Administered 2020-07-12 – 2020-07-14 (×5): 800 mg via ORAL
  Filled 2020-07-12 (×5): qty 1

## 2020-07-12 MED ORDER — NALBUPHINE HCL 10 MG/ML IJ SOLN
5.0000 mg | INTRAMUSCULAR | Status: DC | PRN
  Administered 2020-07-13: 5 mg via INTRAVENOUS
  Filled 2020-07-12: qty 1

## 2020-07-12 MED ORDER — BUPIVACAINE IN DEXTROSE 0.75-8.25 % IT SOLN
INTRATHECAL | Status: DC | PRN
  Administered 2020-07-12: 1.6 mL via INTRATHECAL

## 2020-07-12 MED ORDER — ESCITALOPRAM OXALATE 10 MG PO TABS
5.0000 mg | ORAL_TABLET | Freq: Every day | ORAL | Status: DC
  Administered 2020-07-13: 5 mg via ORAL
  Filled 2020-07-12 (×2): qty 1

## 2020-07-12 MED ORDER — LACTATED RINGERS IV SOLN: INTRAVENOUS | Status: DC

## 2020-07-12 MED ORDER — MENTHOL 3 MG MT LOZG: 1.0000 | LOZENGE | OROMUCOSAL | Status: DC | PRN

## 2020-07-12 MED ORDER — KETOROLAC TROMETHAMINE 30 MG/ML IJ SOLN
30.0000 mg | Freq: Four times a day (QID) | INTRAMUSCULAR | Status: AC | PRN
  Administered 2020-07-12: 30 mg via INTRAMUSCULAR

## 2020-07-12 MED ORDER — SODIUM CHLORIDE 0.9 % IR SOLN
Status: DC | PRN
  Administered 2020-07-12: 1

## 2020-07-12 MED ORDER — ACETAMINOPHEN 500 MG PO TABS
ORAL_TABLET | ORAL | Status: AC
  Filled 2020-07-12: qty 2

## 2020-07-12 MED ORDER — DEXAMETHASONE SODIUM PHOSPHATE 10 MG/ML IJ SOLN
INTRAMUSCULAR | Status: AC
  Filled 2020-07-12: qty 1

## 2020-07-12 MED ORDER — ONDANSETRON HCL 4 MG/2ML IJ SOLN: 4.0000 mg | Freq: Three times a day (TID) | INTRAMUSCULAR | Status: DC | PRN

## 2020-07-12 MED ORDER — SOD CITRATE-CITRIC ACID 500-334 MG/5ML PO SOLN
30.0000 mL | ORAL | Status: AC
  Administered 2020-07-12: 30 mL via ORAL

## 2020-07-12 MED ORDER — GENTAMICIN SULFATE 40 MG/ML IJ SOLN
5.0000 mg/kg | INTRAVENOUS | Status: AC
  Administered 2020-07-12: 290 mg via INTRAVENOUS
  Filled 2020-07-12: qty 7.25

## 2020-07-12 MED ORDER — TETANUS-DIPHTH-ACELL PERTUSSIS 5-2.5-18.5 LF-MCG/0.5 IM SUSY: 0.5000 mL | PREFILLED_SYRINGE | Freq: Once | INTRAMUSCULAR | Status: DC

## 2020-07-12 MED ORDER — PHENYLEPHRINE HCL-NACL 20-0.9 MG/250ML-% IV SOLN
INTRAVENOUS | Status: DC | PRN
  Administered 2020-07-12: 60 ug/min via INTRAVENOUS

## 2020-07-12 MED ORDER — DIPHENHYDRAMINE HCL 25 MG PO CAPS: 25.0000 mg | ORAL_CAPSULE | ORAL | Status: DC | PRN

## 2020-07-12 MED ORDER — SENNOSIDES-DOCUSATE SODIUM 8.6-50 MG PO TABS
2.0000 | ORAL_TABLET | ORAL | Status: DC
  Administered 2020-07-13: 2 via ORAL
  Filled 2020-07-12: qty 2

## 2020-07-12 MED ORDER — NALBUPHINE HCL 10 MG/ML IJ SOLN: 5.0000 mg | INTRAMUSCULAR | Status: DC | PRN

## 2020-07-12 MED ORDER — KETOROLAC TROMETHAMINE 30 MG/ML IJ SOLN
INTRAMUSCULAR | Status: AC
  Filled 2020-07-12: qty 1

## 2020-07-12 MED ORDER — SIMETHICONE 80 MG PO CHEW
80.0000 mg | CHEWABLE_TABLET | ORAL | Status: DC | PRN
Start: 2020-07-12 — End: 2020-07-14

## 2020-07-12 MED ORDER — PHENYLEPHRINE HCL-NACL 20-0.9 MG/250ML-% IV SOLN
INTRAVENOUS | Status: AC
  Filled 2020-07-12: qty 250

## 2020-07-12 MED ORDER — SCOPOLAMINE 1 MG/3DAYS TD PT72: 1.0000 | MEDICATED_PATCH | Freq: Once | TRANSDERMAL | Status: DC

## 2020-07-12 MED ORDER — DIBUCAINE (PERIANAL) 1 % EX OINT: 1.0000 "application " | TOPICAL_OINTMENT | CUTANEOUS | Status: DC | PRN

## 2020-07-12 MED ORDER — NALBUPHINE HCL 10 MG/ML IJ SOLN: 5.0000 mg | Freq: Once | INTRAMUSCULAR | Status: DC | PRN

## 2020-07-12 MED ORDER — FERROUS SULFATE 325 (65 FE) MG PO TABS
325.0000 mg | ORAL_TABLET | Freq: Two times a day (BID) | ORAL | Status: DC
  Administered 2020-07-12 – 2020-07-13 (×2): 325 mg via ORAL
  Filled 2020-07-12 (×2): qty 1

## 2020-07-12 MED ORDER — FENTANYL CITRATE (PF) 100 MCG/2ML IJ SOLN: 25.0000 ug | INTRAMUSCULAR | Status: DC | PRN

## 2020-07-12 MED ORDER — METHYLERGONOVINE MALEATE 0.2 MG/ML IJ SOLN: 0.2000 mg | INTRAMUSCULAR | Status: DC | PRN

## 2020-07-12 MED ORDER — PRENATAL MULTIVITAMIN CH
1.0000 | ORAL_TABLET | Freq: Every day | ORAL | Status: DC
  Administered 2020-07-13: 1 via ORAL
  Filled 2020-07-12: qty 1

## 2020-07-12 MED ORDER — MORPHINE SULFATE (PF) 0.5 MG/ML IJ SOLN
INTRAMUSCULAR | Status: AC
  Filled 2020-07-12: qty 10

## 2020-07-12 MED ORDER — FLEET ENEMA 7-19 GM/118ML RE ENEM
1.0000 | ENEMA | Freq: Every day | RECTAL | Status: DC | PRN
  Administered 2020-07-13: 1 via RECTAL

## 2020-07-12 MED ORDER — KETOROLAC TROMETHAMINE 30 MG/ML IJ SOLN
30.0000 mg | Freq: Four times a day (QID) | INTRAMUSCULAR | Status: AC | PRN
  Administered 2020-07-12: 30 mg via INTRAVENOUS
  Filled 2020-07-12: qty 1

## 2020-07-12 MED ORDER — OXYTOCIN-SODIUM CHLORIDE 30-0.9 UT/500ML-% IV SOLN
2.5000 [IU]/h | INTRAVENOUS | Status: AC
  Administered 2020-07-12: 2.5 [IU]/h via INTRAVENOUS
  Filled 2020-07-12: qty 500

## 2020-07-12 MED ORDER — OXYCODONE-ACETAMINOPHEN 5-325 MG PO TABS
1.0000 | ORAL_TABLET | ORAL | Status: DC | PRN
  Administered 2020-07-13: 1 via ORAL
  Administered 2020-07-13: 2 via ORAL
  Administered 2020-07-13: 1 via ORAL
  Filled 2020-07-12: qty 2
  Filled 2020-07-12: qty 1
  Filled 2020-07-12: qty 2

## 2020-07-12 MED ORDER — ZOLPIDEM TARTRATE 5 MG PO TABS: 5.0000 mg | ORAL_TABLET | Freq: Every evening | ORAL | Status: DC | PRN

## 2020-07-12 MED ORDER — ONDANSETRON HCL 4 MG/2ML IJ SOLN
INTRAMUSCULAR | Status: DC | PRN
  Administered 2020-07-12: 4 mg via INTRAVENOUS

## 2020-07-12 MED ORDER — SODIUM CHLORIDE 0.9% FLUSH: 3.0000 mL | INTRAVENOUS | Status: DC | PRN

## 2020-07-12 MED ORDER — ACETAMINOPHEN 500 MG PO TABS
1000.0000 mg | ORAL_TABLET | ORAL | Status: AC
  Administered 2020-07-12: 1000 mg via ORAL

## 2020-07-12 MED ORDER — MORPHINE SULFATE (PF) 0.5 MG/ML IJ SOLN
INTRAMUSCULAR | Status: DC | PRN
  Administered 2020-07-12: 150 ug via INTRATHECAL

## 2020-07-12 MED ORDER — MEPERIDINE HCL 25 MG/ML IJ SOLN
6.2500 mg | INTRAMUSCULAR | Status: DC | PRN
Start: 2020-07-12 — End: 2020-07-12

## 2020-07-12 MED ORDER — ONDANSETRON HCL 4 MG/2ML IJ SOLN
INTRAMUSCULAR | Status: AC
  Filled 2020-07-12: qty 2

## 2020-07-12 MED ORDER — DIPHENHYDRAMINE HCL 25 MG PO CAPS: 25.0000 mg | ORAL_CAPSULE | Freq: Four times a day (QID) | ORAL | Status: DC | PRN

## 2020-07-12 SURGICAL SUPPLY — 34 items
APL SKNCLS STERI-STRIP NONHPOA (GAUZE/BANDAGES/DRESSINGS) ×1
BENZOIN TINCTURE PRP APPL 2/3 (GAUZE/BANDAGES/DRESSINGS) ×2 IMPLANT
CHLORAPREP W/TINT 26ML (MISCELLANEOUS) ×2 IMPLANT
CLAMP CORD UMBIL (MISCELLANEOUS) IMPLANT
CLIP FILSHIE TUBAL LIGA STRL (Clip) ×2 IMPLANT
CLOSURE STERI STRIP 1/2 X4 (GAUZE/BANDAGES/DRESSINGS) ×2 IMPLANT
CLOTH BEACON ORANGE TIMEOUT ST (SAFETY) ×2 IMPLANT
DRSG OPSITE POSTOP 4X10 (GAUZE/BANDAGES/DRESSINGS) ×2 IMPLANT
ELECT REM PT RETURN 9FT ADLT (ELECTROSURGICAL) ×2
ELECTRODE REM PT RTRN 9FT ADLT (ELECTROSURGICAL) ×1 IMPLANT
EXTRACTOR VACUUM BELL STYLE (SUCTIONS) IMPLANT
GLOVE BIO SURGEON STRL SZ7 (GLOVE) ×2 IMPLANT
GLOVE BIOGEL PI IND STRL 7.0 (GLOVE) ×1 IMPLANT
GLOVE BIOGEL PI INDICATOR 7.0 (GLOVE) ×1
GOWN STRL REUS W/TWL LRG LVL3 (GOWN DISPOSABLE) ×4 IMPLANT
KIT ABG SYR 3ML LUER SLIP (SYRINGE) IMPLANT
NEEDLE HYPO 25X5/8 SAFETYGLIDE (NEEDLE) IMPLANT
NS IRRIG 1000ML POUR BTL (IV SOLUTION) ×2 IMPLANT
PACK C SECTION WH (CUSTOM PROCEDURE TRAY) ×2 IMPLANT
PAD OB MATERNITY 4.3X12.25 (PERSONAL CARE ITEMS) ×2 IMPLANT
PENCIL SMOKE EVAC W/HOLSTER (ELECTROSURGICAL) ×2 IMPLANT
RTRCTR C-SECT PINK 25CM LRG (MISCELLANEOUS) ×2 IMPLANT
STRIP CLOSURE SKIN 1/2X4 (GAUZE/BANDAGES/DRESSINGS) ×2 IMPLANT
SUT MNCRL 0 VIOLET CTX 36 (SUTURE) ×2 IMPLANT
SUT MONOCRYL 0 CTX 36 (SUTURE) ×2
SUT PLAIN 2 0 XLH (SUTURE) ×2 IMPLANT
SUT VIC AB 0 CT1 27 (SUTURE) ×4
SUT VIC AB 0 CT1 27XBRD ANBCTR (SUTURE) ×2 IMPLANT
SUT VIC AB 2-0 CT1 27 (SUTURE) ×2
SUT VIC AB 2-0 CT1 TAPERPNT 27 (SUTURE) ×1 IMPLANT
SUT VIC AB 4-0 KS 27 (SUTURE) ×2 IMPLANT
TOWEL OR 17X24 6PK STRL BLUE (TOWEL DISPOSABLE) ×2 IMPLANT
TRAY FOLEY W/BAG SLVR 14FR LF (SET/KITS/TRAYS/PACK) ×2 IMPLANT
WATER STERILE IRR 1000ML POUR (IV SOLUTION) ×2 IMPLANT

## 2020-07-12 NOTE — Lactation Note (Signed)
This note was copied from a baby's chart. Lactation Consultation Note  Patient Name: Boy Starleen Trussell ZESPQ'Z Date: 07/12/2020   Age:26 hours  Mom is a P2 who has "been leaking for quite some time." Mom reports that "he's latching pretty good" and Mom commented that this infant is doing better than his older brother had (now 12 months old). Mom nursed her 1st child for 2-3 weeks, but then switched to formula b/c of infant's jaundice. Mom commented that with her 1st baby, the admission at birth was 5 days long b/c of neonatal jaundice.   Mom has excellent veining on her breasts which happened with this pregnancy. It seems that she had more breast changes with this pregnancy than the last.   I assisted with infant latching twice. Infant latched with relative ease. Some swallows noted (verified by cervical auscultation).   After the 2nd latch, I noted that infant seemed to be breathing slightly louder (despite making room for nose at breast). Mom also commented on infant seeming to breathe harder. I adjusted infant's position so that he was less prone on Mom and I noted what appeared to be mild retractions under the R ribcage. I made Randa Evens, RN aware & removed infant from the breast so that the RN could assess him. Note: While infant was at the breast, he was noted to be pink; he had no nasal flaring; and he was continuing to suckle.    Mom is noted to be on escitalopram 5 mg qd (L2).   Lurline Hare [facility] 07/12/2020, 12:36 PM

## 2020-07-12 NOTE — Anesthesia Preprocedure Evaluation (Signed)
Anesthesia Evaluation  Patient identified by MRN, date of birth, ID band Patient awake    Reviewed: Allergy & Precautions, NPO status , Patient's Chart, lab work & pertinent test results  Airway Mallampati: II  TM Distance: >3 FB Neck ROM: Full    Dental   Pulmonary neg pulmonary ROS,    breath sounds clear to auscultation       Cardiovascular negative cardio ROS   Rhythm:Regular Rate:Normal     Neuro/Psych Depression negative neurological ROS     GI/Hepatic negative GI ROS, Neg liver ROS,   Endo/Other  negative endocrine ROS  Renal/GU negative Renal ROS     Musculoskeletal   Abdominal   Peds  Hematology  (+) anemia ,   Anesthesia Other Findings   Reproductive/Obstetrics (+) Pregnancy                             Anesthesia Physical Anesthesia Plan  ASA: III  Anesthesia Plan: Spinal   Post-op Pain Management:    Induction:   PONV Risk Score and Plan: 2 and Treatment may vary due to age or medical condition  Airway Management Planned: Natural Airway  Additional Equipment:   Intra-op Plan:   Post-operative Plan:   Informed Consent: I have reviewed the patients History and Physical, chart, labs and discussed the procedure including the risks, benefits and alternatives for the proposed anesthesia with the patient or authorized representative who has indicated his/her understanding and acceptance.       Plan Discussed with: CRNA  Anesthesia Plan Comments:         Anesthesia Quick Evaluation

## 2020-07-12 NOTE — Anesthesia Postprocedure Evaluation (Signed)
Anesthesia Post Note  Patient: Christine Osborn  Procedure(s) Performed: CESAREAN SECTION (N/A )     Patient location during evaluation: PACU Anesthesia Type: Spinal Level of consciousness: awake and alert Pain management: pain level controlled Vital Signs Assessment: post-procedure vital signs reviewed and stable Respiratory status: spontaneous breathing and respiratory function stable Cardiovascular status: blood pressure returned to baseline and stable Postop Assessment: spinal receding Anesthetic complications: no   No complications documented.  Last Vitals:  Vitals:   07/12/20 0950 07/12/20 1100  BP: (!) 114/56 (!) 105/59  Pulse: (!) 58 (!) 58  Resp: 16 17  Temp: 36.8 C 37.1 C  SpO2: 97% 98%    Last Pain:  Vitals:   07/12/20 1100  TempSrc: Oral  PainSc: 0-No pain                 Kennieth Rad

## 2020-07-12 NOTE — Op Note (Addendum)
07/12/2020  8:16 AM  PATIENT:  Christine Osborn  25 y.o. female  PRE-OPERATIVE DIAGNOSIS:  Repeat Cesarean section and bilateral tubal ligation  POST-OPERATIVE DIAGNOSIS:  Repeat Cesarean section and bilateral tubal ligation  PROCEDURE:  Procedure(s): CESAREAN SECTION (N/A)  SURGEON:  Surgeon(s) and Role:    * Carrington Clamp, MD - Primary  ANESTHESIA:   spinal  EBL:  322 cc  SPECIMEN:  No Specimen  DISPOSITION OF SPECIMEN:  N/A  COUNTS:  YES  TOURNIQUET:  * No tourniquets in log *  DICTATION: .Note written in EPIC  PLAN OF CARE: Admit to inpatient   PATIENT DISPOSITION:  PACU - hemodynamically stable.   Delay start of Pharmacological VTE agent (>24hrs) due to surgical blood loss or risk of bleeding: not applicable  Complications:  None. Medications:  Ancef, Pitocin Findings:  Baby female, Apgars 9,9, weight P.   Normal tubes, ovaries and uterus seen.  Baby was skin to skin with mother after birth in the OR.  Technique:  After adequate spinal anesthesia was achieved, the patient was prepped and draped in usual sterile fashion.  A foley catheter was used to drain the bladder.  A pfannanstiel incision was made with the scalpel and carried down to the fascia with the bovie cautery. The fascia was incised in the midline with the scalpel and carried in a transverse curvilinear manner bilaterally.  The fascia was reflected superiorly and inferiorly off the rectus muscles and the muscles split in the midline.  A bowel free portion of the peritoneum was entered bluntly and then extended in a superior and inferior manner with good visualization of the bowel and bladder.  The Alexis instrument was then placed and the vesico-uterine fascia tented up and incised in a transverse curvilinear manner.  A 2 cm transverse incision was made in the upper portion of the lower uterine segment until the amnion was exposed.   The incision was extended transversely in a blunt manner.  Clear fluid was  noted and the baby delivered in the vertex presentation without complication.  The baby was bulb suctioned and the cord was clamped and cut aftet stripping blood from cord into baby.  The baby was then handed to awaiting Neonatology.  The placenta was then delivered manually and the uterus cleared of all debris.  The uterine incision was then closed with a running lock stitch of 0 monocryl.  An imbricating layer of 0 monocryl was closed as well. Excellent hemostasis of the uterine incision was achieved and the abdomen was cleared with irrigation.  At this point the tubal ligation was begun.  Prior to the surgery the patient was counseled that the procedure was permanent and all risks, benefits, and alternatives had been discussed.  The each tube in succession was tented up with two babcocks and the filschie clips were placed on the isthmic portion of each tube.  The clips were confirmed to occlude the entire circumference of each tube. The peritoneum was closed with a running stitch of 2-0 vicryl.  This incorporated the rectus muscles as a separate layer.  The fascia was then closed with a running stitch of 0 vicryl.  The subcutaneous layer was closed with interrupted  stitches of 2-0 plain gut.  The skin was closed with 4-0 vicryl on a Keith needle and steri-strips.  The patient tolerated the procedure well and was returned to the recovery room in stable condition.  All counts were correct times three.  Loney Laurence

## 2020-07-12 NOTE — Brief Op Note (Signed)
07/12/2020  8:16 AM  PATIENT:  Christine Osborn  25 y.o. female  PRE-OPERATIVE DIAGNOSIS:  Repeat Cesarean section and bilateral tubal ligation  POST-OPERATIVE DIAGNOSIS:  Repeat Cesarean section and bilateral tubal ligation  PROCEDURE:  Procedure(s): CESAREAN SECTION (N/A)  SURGEON:  Surgeon(s) and Role:    * Carrington Clamp, MD - Primary  ANESTHESIA:   spinal  EBL:  322 cc  SPECIMEN:  No Specimen  DISPOSITION OF SPECIMEN:  N/A  COUNTS:  YES  TOURNIQUET:  * No tourniquets in log *  DICTATION: .Note written in EPIC  PLAN OF CARE: Admit to inpatient   PATIENT DISPOSITION:  PACU - hemodynamically stable.   Delay start of Pharmacological VTE agent (>24hrs) due to surgical blood loss or risk of bleeding: not applicable

## 2020-07-12 NOTE — Progress Notes (Signed)
There has been no change in the patients history, status or exam since the history and physical.  Vitals:   07/12/20 0559  BP: 125/90  Pulse: 80  Resp: 18  Temp: 98.9 F (37.2 C)  TempSrc: Oral  SpO2: 100%  Weight: 75.3 kg  Height: 5\' 2"  (1.575 m)    Results for orders placed or performed during the hospital encounter of 07/12/20 (from the past 72 hour(s))  Resp Panel by RT-PCR (Flu A&B, Covid) Nasopharyngeal Swab     Status: None   Collection Time: 07/12/20  5:02 AM   Specimen: Nasopharyngeal Swab; Nasopharyngeal(NP) swabs in vial transport medium  Result Value Ref Range   SARS Coronavirus 2 by RT PCR NEGATIVE NEGATIVE    Comment: (NOTE) SARS-CoV-2 target nucleic acids are NOT DETECTED.  The SARS-CoV-2 RNA is generally detectable in upper respiratory specimens during the acute phase of infection. The lowest concentration of SARS-CoV-2 viral copies this assay can detect is 138 copies/mL. A negative result does not preclude SARS-Cov-2 infection and should not be used as the sole basis for treatment or other patient management decisions. A negative result may occur with  improper specimen collection/handling, submission of specimen other than nasopharyngeal swab, presence of viral mutation(s) within the areas targeted by this assay, and inadequate number of viral copies(<138 copies/mL). A negative result must be combined with clinical observations, patient history, and epidemiological information. The expected result is Negative.  Fact Sheet for Patients:  09/09/20  Fact Sheet for Healthcare Providers:  BloggerCourse.com  This test is no t yet approved or cleared by the SeriousBroker.it FDA and  has been authorized for detection and/or diagnosis of SARS-CoV-2 by FDA under an Emergency Use Authorization (EUA). This EUA will remain  in effect (meaning this test can be used) for the duration of the COVID-19 declaration  under Section 564(b)(1) of the Act, 21 U.S.C.section 360bbb-3(b)(1), unless the authorization is terminated  or revoked sooner.       Influenza A by PCR NEGATIVE NEGATIVE   Influenza B by PCR NEGATIVE NEGATIVE    Comment: (NOTE) The Xpert Xpress SARS-CoV-2/FLU/RSV plus assay is intended as an aid in the diagnosis of influenza from Nasopharyngeal swab specimens and should not be used as a sole basis for treatment. Nasal washings and aspirates are unacceptable for Xpert Xpress SARS-CoV-2/FLU/RSV testing.  Fact Sheet for Patients: [place]  Fact Sheet for Healthcare Providers: BloggerCourse.com  This test is not yet approved or cleared by the SeriousBroker.it FDA and has been authorized for detection and/or diagnosis of SARS-CoV-2 by FDA under an Emergency Use Authorization (EUA). This EUA will remain in effect (meaning this test can be used) for the duration of the COVID-19 declaration under Section 564(b)(1) of the Act, 21 U.S.C. section 360bbb-3(b)(1), unless the authorization is terminated or revoked.  Performed at [facility], [address], [place], [place]     [id number]

## 2020-07-12 NOTE — Transfer of Care (Signed)
Immediate Anesthesia Transfer of Care Note  Patient: Christine Osborn  Procedure(s) Performed: CESAREAN SECTION (N/A )  Patient Location: PACU  Anesthesia Type:Spinal  Level of Consciousness: awake, alert  and patient cooperative  Airway & Oxygen Therapy: Patient Spontanous Breathing  Post-op Assessment: Report given to RN and Post -op Vital signs reviewed and stable  Post vital signs: Reviewed and stable  Last Vitals:  Vitals Value Taken Time  BP 105/54 07/12/20 0827  Temp 98   Pulse 68 07/12/20 0828  Resp 18 07/12/20 0828  SpO2 99 % 07/12/20 0828  Vitals shown include unvalidated device data.  Last Pain:  Vitals:   07/12/20 0559  TempSrc: Oral         Complications: No complications documented.

## 2020-07-12 NOTE — Anesthesia Procedure Notes (Signed)
Spinal  Patient location during procedure: OR Start time: 07/12/2020 7:20 AM End time: 07/12/2020 7:25 AM Staffing Performed: anesthesiologist  Anesthesiologist: Marcene Duos, MD Preanesthetic Checklist Completed: patient identified, IV checked, site marked, risks and benefits discussed, surgical consent, monitors and equipment checked, pre-op evaluation and timeout performed Spinal Block Patient position: sitting Prep: DuraPrep Patient monitoring: heart rate, cardiac monitor, continuous pulse ox and blood pressure Approach: midline Location: L4-5 Injection technique: single-shot Needle Needle type: Sprotte  Needle gauge: 24 G Needle length: 9 cm Assessment Sensory level: T4

## 2020-07-13 ENCOUNTER — Encounter (HOSPITAL_COMMUNITY): Payer: Self-pay | Admitting: Obstetrics and Gynecology

## 2020-07-13 LAB — CBC
HCT: 24.6 % — ABNORMAL LOW (ref 36.0–46.0)
Hemoglobin: 7.5 g/dL — ABNORMAL LOW (ref 12.0–15.0)
MCH: 25 pg — ABNORMAL LOW (ref 26.0–34.0)
MCHC: 30.5 g/dL (ref 30.0–36.0)
MCV: 82 fL (ref 80.0–100.0)
Platelets: 130 10*3/uL — ABNORMAL LOW (ref 150–400)
RBC: 3 MIL/uL — ABNORMAL LOW (ref 3.87–5.11)
RDW: 16.3 % — ABNORMAL HIGH (ref 11.5–15.5)
WBC: 11.8 10*3/uL — ABNORMAL HIGH (ref 4.0–10.5)
nRBC: 0 % (ref 0.0–0.2)

## 2020-07-13 MED ORDER — ACETAMINOPHEN 325 MG PO TABS: 650.0000 mg | ORAL_TABLET | Freq: Four times a day (QID) | ORAL | Status: DC | PRN

## 2020-07-13 MED ORDER — OXYCODONE HCL 5 MG PO TABS: 5.0000 mg | ORAL_TABLET | ORAL | Status: DC | PRN

## 2020-07-13 MED ORDER — FERROUS SULFATE 325 (65 FE) MG PO TABS
325.0000 mg | ORAL_TABLET | Freq: Three times a day (TID) | ORAL | Status: DC
  Administered 2020-07-13: 325 mg via ORAL
  Filled 2020-07-13 (×3): qty 1

## 2020-07-13 MED ORDER — OXYCODONE HCL 5 MG PO TABS
10.0000 mg | ORAL_TABLET | ORAL | Status: DC | PRN
  Administered 2020-07-14: 10 mg via ORAL
  Filled 2020-07-13: qty 2

## 2020-07-13 MED ORDER — FLEET ENEMA 7-19 GM/118ML RE ENEM: 1.0000 | ENEMA | Freq: Once | RECTAL | Status: DC

## 2020-07-13 NOTE — Lactation Note (Signed)
This note was copied from a baby's chart. Lactation Consultation Note  Patient Name: Christine Osborn LEXNT'Z Date: 07/13/2020 Reason for consult: Follow-up assessment Age:26 hours   Mother concerned that she is not leaking milk . She was leaking colostrum on day one and now she doesn't see any milk. She has started giving formula and is using a pacifier because infant kept them up all night. Discussed cluster feeding with parents.   Offered to assist mother with hand expression. Mother has large flow of milk when hand expressed. Mother very happy to see this large volume.   Advised mother to limit use of the pacifier. Encouraged to cue base feed infant and to allow for cluster feeding.  Encouraged mother to pump after feedings and use her own milk to offer and to stimulate milk production.  Mother receptive to all teaching.   Maternal Data Has patient been taught Hand Expression?: Yes  Feeding Feeding Type: Bottle Fed - Formula Nipple Type: Extra Slow Flow  LATCH Score                   Interventions    Lactation Tools Discussed/Used     Consult Status Consult Status: Follow-up Date: 07/14/20 Follow-up type: In-patient    Stevan Born [facility] 07/13/2020, 2:43 PM

## 2020-07-13 NOTE — Progress Notes (Signed)
Patient is eating, ambulating, voiding.  Pain control is good.  +flatus, no complaints but sore.  Vitals:   07/12/20 1700 07/12/20 2031 07/13/20 0007 07/13/20 0511  BP: 125/67 (!) 107/57 (!) 100/53 (!) 113/57  Pulse: 60 (!) 57 (!) 58 73  Resp: 18 18 18 20   Temp: 98.4 F (36.9 C) 98.3 F (36.8 C) (!) 97.5 F (36.4 C) 98.5 F (36.9 C)  TempSrc: Oral Oral Oral Oral  SpO2: 98% 99% 100% 100%  Weight:      Height:        Fundus firm Inc: c/d/i Ext: no calf tenderness  Lab Results  Component Value Date   WBC 11.8 (H) 07/13/2020   HGB 7.5 (L) 07/13/2020   HCT 24.6 (L) 07/13/2020   MCV 82.0 07/13/2020   PLT 130 (L) 07/13/2020    --/--/O NEG (12/30 1024)  A/P Post op day #1 s/p repeat c/s with BTL Chronic anemia- continue iron tid  Routine care.  Expect d/c 1/4.    05-14-2002

## 2020-07-13 NOTE — Clinical Social Work Maternal (Signed)
CLINICAL SOCIAL WORK MATERNAL/CHILD NOTE  Patient Details  Name: Christine Osborn MRN: [id number] Date of Birth: 08/30/1995  Date:  2021/04/18  Clinical Social Worker Initiating Note:  Hortencia Pilar, LCSW Date/Time: Initiated:  07/13/20/1025     Child's Name:  Christine Osborn Reasons   Biological Parents:  Mother,Father [facility] Lesia Hausen)   Need for Interpreter:  None   Reason for Referral:  Behavioral Health Concerns   Address:  [address] [place] [address]    Phone number:  [phone] (home)     Additional phone number: none   Household Members/Support Persons (HM/SP):   Household Member/Support Person 1,Household Member/Support Person 2   HM/SP Name Relationship DOB or Age  HM/SP -1  Vong Garringer 03/17/1995  HM/SP -2 Rhae Hammock FOB 26 years old   HM/SP -3 Graylon Tedesco  son 26 years old  HM/SP -4        HM/SP -5        HM/SP -6        HM/SP -7        HM/SP -8          Natural Supports (not living in the home):      Professional Supports: None   Employment: Full-time   Type of Work: Haematologist at [employer]   Education:  Some College   Homebound arranged:  n/a  Surveyor, quantity Resources:  Self-Pay    Other Resources:    none   Cultural/Religious Considerations Which May Impact Care:  none reported.   Strengths:  Ability to meet basic needs ,Compliance with medical plan ,Home prepared for child ,Pediatrician chosen,Psychotropic Medications   Psychotropic Medications:  Lexapro,Wellbutrin      Pediatrician:    [place] area  Pediatrician List:   [facility]  [place]    [place]      Pediatrician Fax Number:    Risk Factors/Current Problems:  None   Cognitive State:  Alert ,Able to Concentrate ,Insightful    Mood/Affect:  Relaxed ,Comfortable ,Interested ,Happy    CSW Assessment: CSW consulted due to MOB having a hx of anxiety  depression and Suicidal ideation during pregnancy. CSW went to speak with MOB at bedside to address further needs.   CSW congratulated MOB and FOB on the birth of infant. CSW advised MOB of the HIPPA policy and asked that FOB step out of room so that CSW could speak with MOB alone, in which FOB and MOB were agreeable to. CSW then advised MOB of CSW's role and the reason for CSW coming to speak with her. MOB expressed that she was diagnosed with anxiety and depression "quite a few years ago". MOB expressed that she is on Wellbutrin and Lexapro for her mental health. MOB indicates that both medications work well for her and that she is not interested in therapy at this time. MOB expressed that she has no other mental health hx and denies current feelings of SI, HI and being involved in DV.   CSW inquired from [facility] on SI in August 2021 in which MOB expressed that she was just very fearful. MOB expressed that since that indicated  she hasn't felt any Suicidal ideation and reports that she feels that her mental health is okay. CSW asked MOB about supports in which MOB expressed that she has support from her spouse. MOB indicated that she has all needed items to  care for infant including pack n' play for infant to sleep in once arrived home. MOB reported that she has no other needs.   CSW took time to provide MOB with PPD and SIDS. MOB was given PPD Checklist in order to keep track of feelings as they may relate to PPD. MOB thanked CSW and reported no other concern. No barrier's to discharge.   CSW Plan/Description:  Sudden Infant Death Syndrome (SIDS) Education,Perinatal Mood and Anxiety Disorder (PMADs) Education,No Further Intervention Required/No Barriers to Discharge    Merrell Rettinger S Dontee Jaso, LCSWA 07/13/2020, 10:39 AM  

## 2020-07-13 NOTE — Progress Notes (Signed)
Pain having gas pain in abdomen. Rates pain 10/10. Patient has walked in the hallway, taken simethicone tablet and pain medication,drank warmed prune/apple juice, and had a dulcolax suppository that resulted with passing a lot of flatus and a small watery bowel movement. Still complains of abd pain, vital signs stable. Notified Dr. Claiborne Billings. Ordered  Fleets Enema. K-pad given. Will continue to monitor

## 2020-07-14 MED ORDER — OXYCODONE HCL 5 MG PO TABS: 5.0000 mg | ORAL_TABLET | ORAL | 0 refills | Status: AC | PRN

## 2020-07-14 MED ORDER — DOCUSATE SODIUM 100 MG PO CAPS: 100.0000 mg | ORAL_CAPSULE | Freq: Two times a day (BID) | ORAL | 0 refills | Status: AC

## 2020-07-14 MED ORDER — IBUPROFEN 800 MG PO TABS: 800.0000 mg | ORAL_TABLET | Freq: Three times a day (TID) | ORAL | 0 refills | Status: AC

## 2020-07-14 MED ORDER — ACETAMINOPHEN 325 MG PO TABS: 650.0000 mg | ORAL_TABLET | Freq: Four times a day (QID) | ORAL | 3 refills | Status: AC | PRN

## 2020-07-14 MED ORDER — FERROUS SULFATE 325 (65 FE) MG PO TABS: 325.0000 mg | ORAL_TABLET | Freq: Three times a day (TID) | ORAL | 3 refills | Status: AC

## 2020-07-14 NOTE — Progress Notes (Signed)
Subjective: Postpartum Day 2: Cesarean Delivery Christine Osborn is feeling very well this morning and is eager for discharge. Reports pain is well controlled. She had an episode of severe gas pain last night that is now completely resolved. She is passing flatus. She is tolerating a regular diet, ambulating, and voiding. Incisional pain well controlled. Minimal lochia.   Objective: Patient Vitals for the past 24 hrs:  BP Temp Temp src Pulse Resp SpO2  07/14/20 0534 115/82 98 F (36.7 C) Axillary (!) 57 18 100 %  07/13/20 1945 122/70 97.9 F (36.6 C) Axillary 62 18 100 %  07/13/20 1820 133/83 -- -- 66 20 100 %  07/13/20 1421 (!) 120/59 98 F (36.7 C) Oral 68 20 99 %    Physical Exam:  General: alert, cooperative and no distress Lochia: appropriate Uterine Fundus: firm Incision: healing well, no significant drainage DVT Evaluation: No evidence of DVT seen on physical exam.  Recent Labs    07/13/20 0458  HGB 7.5*  HCT 24.6*    Assessment/Plan:  Christine Osborn G2P2002 POD#2 sp repeat cesarean at 53w2d1. PPC: continue routine care 2. S/p SW consult: no barriers to discharge 3. Stable for discharge home today. Discharge instructions reviewed. Return to office 1-2 weeks for incision and PPD check. 4. Rubella non immune: MMR prior to discharge.   MRowland Lathe1/10/2020, 9:29 AM

## 2020-07-14 NOTE — Discharge Summary (Signed)
Postpartum Discharge Summary    Patient Name: Christine Osborn DOB: 1996/02/23 MRN: [id number]  Date of admission: 07/12/2020 Delivery date:07/12/2020  Delivering provider: Bobbye Charleston  Date of discharge: 07/14/2020  Admitting diagnosis: Post-operative state [Z98.890] Intrauterine pregnancy: [redacted]w[redacted]d    Secondary diagnosis:  Active Problems:   Post-operative state  Additional problems: none    Discharge diagnosis: Term Pregnancy Delivered                                              Post partum procedures:none Augmentation: N/A Complications: None  Hospital course: Sceduled C/S   25y.o. yo G2P2002 at 366w2das admitted to the hospital 07/12/2020 for scheduled cesarean section with the following indication:Elective Repeat.Delivery details are as follows:  Membrane Rupture Time/Date: 7:43 AM ,07/12/2020   Delivery Method:C-Section, Low Transverse  Details of operation can be found in separate operative note.  Patient had an uncomplicated postpartum course.  She is ambulating, tolerating a regular diet, passing flatus, and urinating well. Patient is discharged home in stable condition on  07/14/20        Newborn Data: Birth date:07/12/2020  Birth time:7:43 AM  Gender:Female  Living status:Living  Apgars:9 ,9  Weight:3880 g     Magnesium Sulfate received: No BMZ received: No Rhophylac:No - baby Rh neg MMR: ordered, pending T-DaP:Given prenatally Flu: No Transfusion:No  Physical exam  Vitals:   07/13/20 1421 07/13/20 1820 07/13/20 1945 07/14/20 0534  BP: (!) 120/59 133/83 122/70 115/82  Pulse: 68 66 62 (!) 57  Resp: '20 20 18 18  ' Temp: 98 F (36.7 C)  97.9 F (36.6 C) 98 F (36.7 C)  TempSrc: Oral  Axillary Axillary  SpO2: 99% 100% 100% 100%  Weight:      Height:       General: alert, cooperative and no distress Lochia: appropriate Uterine Fundus: firm Incision: Healing well with no significant drainage DVT Evaluation: No evidence of DVT seen on physical  exam. Labs: Lab Results  Component Value Date   WBC 11.8 (H) 07/13/2020   HGB 7.5 (L) 07/13/2020   HCT 24.6 (L) 07/13/2020   MCV 82.0 07/13/2020   PLT 130 (L) 07/13/2020   No flowsheet data found. Edinburgh Score: Edinburgh Postnatal Depression Scale Screening Tool 07/13/2020  I have been able to laugh and see the funny side of things. 0  I have looked forward with enjoyment to things. 0  I have blamed myself unnecessarily when things went wrong. 0  I have been anxious or worried for no good reason. 1  I have felt scared or panicky for no good reason. 1  Things have been getting on top of me. 1  I have been so unhappy that I have had difficulty sleeping. 0  I have felt sad or miserable. 0  I have been so unhappy that I have been crying. 0  The thought of harming myself has occurred to me. 0  Edinburgh Postnatal Depression Scale Total 3      After visit meds:  Allergies as of 07/14/2020      Reactions   Penicillins Hives   Has patient had a PCN reaction causing immediate rash, facial/tongue/throat swelling, SOB or lightheadedness with hypotension: no Has patient had a PCN reaction causing severe rash involving mucus membranes or skin necrosis: no Has patient had a PCN reaction that required  hospitalization: unknown Has patient had a PCN reaction occurring within the last 10 years: no If all of the above answers are "NO", then may proceed with Cephalosporin use.      Medication List    STOP taking these medications   cefadroxil 500 MG capsule Commonly known as: DURICEF   escitalopram 5 MG tablet Commonly known as: Lexapro   ferrous sulfate 325 (65 FE) MG EC tablet Replaced by: ferrous sulfate 325 (65 FE) MG tablet   ondansetron 8 MG disintegrating tablet Commonly known as: Zofran ODT     TAKE these medications   acetaminophen 325 MG tablet Commonly known as: TYLENOL Take 2 tablets (650 mg total) by mouth every 6 (six) hours as needed for mild pain. What changed:    medication strength  how much to take  reasons to take this   docusate sodium 100 MG capsule Commonly known as: Colace Take 1 capsule (100 mg total) by mouth 2 (two) times daily.   ferrous sulfate 325 (65 FE) MG tablet Take 1 tablet (325 mg total) by mouth 3 (three) times daily with meals. Replaces: ferrous sulfate 325 (65 FE) MG EC tablet   ibuprofen 800 MG tablet Commonly known as: ADVIL Take 1 tablet (800 mg total) by mouth every 8 (eight) hours.   oxyCODONE 5 MG immediate release tablet Commonly known as: Oxy IR/ROXICODONE Take 1 tablet (5 mg total) by mouth every 4 (four) hours as needed for severe pain.        Discharge home in stable condition Infant Feeding: Breast Infant Disposition:home with mother Discharge instruction: per After Visit Summary and Postpartum booklet. Activity: Advance as tolerated. Pelvic rest for 6 weeks.  Diet: routine diet Anticipated Birth Control: BTL done Postpartum Appointment:4 weeks Additional Postpartum F/U: Postpartum Depression checkup and Incision check 1-2 weeks Future Appointments:No future appointments.     07/14/2020 Rowland Lathe, MD

## 2020-07-16 ENCOUNTER — Encounter (HOSPITAL_COMMUNITY): Payer: Self-pay | Admitting: Obstetrics and Gynecology

## 2020-07-16 ENCOUNTER — Other Ambulatory Visit: Payer: Self-pay

## 2020-07-16 ENCOUNTER — Inpatient Hospital Stay (HOSPITAL_COMMUNITY)
Admission: AD | Admit: 2020-07-16 | Discharge: 2020-07-16 | Disposition: A | Discharge status: Home / Self Care | Payer: Self-pay | Attending: Obstetrics and Gynecology | Admitting: Obstetrics and Gynecology

## 2020-07-16 ENCOUNTER — Inpatient Hospital Stay (HOSPITAL_COMMUNITY): Payer: Self-pay

## 2020-07-16 DIAGNOSIS — Z79899 Other long term (current) drug therapy: Secondary | ICD-10-CM | POA: Diagnosis not present

## 2020-07-16 DIAGNOSIS — O99893 Other specified diseases and conditions complicating puerperium: Secondary | ICD-10-CM | POA: Insufficient documentation

## 2020-07-16 DIAGNOSIS — Z880 Allergy status to penicillin: Secondary | ICD-10-CM | POA: Diagnosis not present

## 2020-07-16 DIAGNOSIS — R079 Chest pain, unspecified: Secondary | ICD-10-CM | POA: Insufficient documentation

## 2020-07-16 DIAGNOSIS — Z98891 History of uterine scar from previous surgery: Secondary | ICD-10-CM

## 2020-07-16 DIAGNOSIS — Z791 Long term (current) use of non-steroidal anti-inflammatories (NSAID): Secondary | ICD-10-CM | POA: Diagnosis not present

## 2020-07-16 LAB — COMPREHENSIVE METABOLIC PANEL
ALT: 36 U/L (ref 0–44)
AST: 27 U/L (ref 15–41)
Albumin: 2.9 g/dL — ABNORMAL LOW (ref 3.5–5.0)
Alkaline Phosphatase: 96 U/L (ref 38–126)
Anion gap: 9 (ref 5–15)
BUN: 6 mg/dL (ref 6–20)
CO2: 21 mmol/L — ABNORMAL LOW (ref 22–32)
Calcium: 8.8 mg/dL — ABNORMAL LOW (ref 8.9–10.3)
Chloride: 109 mmol/L (ref 98–111)
Creatinine, Ser: 0.47 mg/dL (ref 0.44–1.00)
GFR, Estimated: >60 mL/min (ref >60)
Glucose, Bld: 93 mg/dL (ref 70–99)
Potassium: 3.6 mmol/L (ref 3.5–5.1)
Sodium: 139 mmol/L (ref 135–145)
Total Bilirubin: 0.2 mg/dL — ABNORMAL LOW (ref 0.3–1.2)
Total Protein: 6.1 g/dL — ABNORMAL LOW (ref 6.5–8.1)

## 2020-07-16 LAB — CBC
HCT: 30.5 % — ABNORMAL LOW (ref 36.0–46.0)
Hemoglobin: 8.9 g/dL — ABNORMAL LOW (ref 12.0–15.0)
MCH: 25.6 pg — ABNORMAL LOW (ref 26.0–34.0)
MCHC: 29.2 g/dL — ABNORMAL LOW (ref 30.0–36.0)
MCV: 87.6 fL (ref 80.0–100.0)
Platelets: 178 10*3/uL (ref 150–400)
RBC: 3.48 MIL/uL — ABNORMAL LOW (ref 3.87–5.11)
RDW: 20.1 % — ABNORMAL HIGH (ref 11.5–15.5)
WBC: 7.6 10*3/uL (ref 4.0–10.5)
nRBC: 0 % (ref 0.0–0.2)

## 2020-07-16 NOTE — MAU Provider Note (Signed)
History     CSN: [id number]  Arrival date and time: 07/16/20 1720   Event Date/Time   First Provider Initiated Contact with Patient 07/16/20 1838      Chief Complaint  Patient presents with  . Chest Pain   Ms. Christine Osborn is a 25 y.o. G2P2002 at 4d pp who presents to MAU for chest pain. Patient reports last night she was awoken from sleep by a chest pain that started in the center of her chest, radiated along her ribs to her side under her right breast. Patient denies breast pain. Patient also reports sometimes she experiences a pressure along with the pain. Patient describes the pain as dull and aching. Patient also describes point tenderness along the sternum and states when she takes a deep breath it hurts at that point. Patient reports two additional incidences of the pain around 1130AM today and around 530PM today. Patient does not correlate any activities with the pain. Patient has been performing normal activities and is not c/o SOB. Patient denies smoking, personal history of blood clot or s/sx of DVT. Patient has not been vaccinated against COVID and denies known exposures.  Pt denies VB, LOF, ctx, decreased FM, vaginal discharge/odor/itching. Pt denies N/V, abdominal pain, constipation, diarrhea, or urinary problems. Pt denies fever, chills, fatigue, sweating or changes in appetite. Pt denies SOB. Pt denies dizziness, HA, light-headedness, weakness.   OB History    Gravida  2   Para  2   Term  2   Preterm      AB      Living  2     SAB      IAB      Ectopic      Multiple  0   Live Births  2           Past Medical History:  Diagnosis Date  . Depression   . Morning sickness 03/14/2017    Past Surgical History:  Procedure Laterality Date  . CESAREAN SECTION N/A 09/15/2017   Procedure: CESAREAN SECTION;  Surgeon: Carrington Clamp, MD;  Location: [facility];  Service: Obstetrics;  Laterality: N/A;  . CESAREAN SECTION N/A 07/12/2020    Procedure: CESAREAN SECTION;  Surgeon: Carrington Clamp, MD;  Location: [facility] LD ORS;  Service: Obstetrics;  Laterality: N/A;  . OTHER SURGICAL HISTORY     Pt required second spinal epidural    Family History  Problem Relation Age of Onset  . Hypertension Mother   . Stroke Mother   . Hypertension Father     Social History   Tobacco Use  . Smoking status: Never Smoker  . Smokeless tobacco: Never Used  Vaping Use  . Vaping Use: Never used  Substance Use Topics  . Alcohol use: No  . Drug use: No    Allergies:  Allergies  Allergen Reactions  . Penicillins Hives    Has patient had a PCN reaction causing immediate rash, facial/tongue/throat swelling, SOB or lightheadedness with hypotension: no Has patient had a PCN reaction causing severe rash involving mucus membranes or skin necrosis: no Has patient had a PCN reaction that required hospitalization: unknown Has patient had a PCN reaction occurring within the last 10 years: no If all of the above answers are "NO", then may proceed with Cephalosporin use.     Medications Prior to Admission  Medication Sig Dispense Refill Last Dose  . acetaminophen (TYLENOL) 325 MG tablet Take 2 tablets (650 mg total) by mouth every 6 (six) hours  as needed for mild pain. 60 tablet 3 07/16/2020 at Unknown time  . docusate sodium (COLACE) 100 MG capsule Take 1 capsule (100 mg total) by mouth 2 (two) times daily. 60 capsule 0 07/16/2020 at Unknown time  . ferrous sulfate 325 (65 FE) MG tablet Take 1 tablet (325 mg total) by mouth 3 (three) times daily with meals. 90 tablet 3 07/16/2020 at Unknown time  . ibuprofen (ADVIL) 800 MG tablet Take 1 tablet (800 mg total) by mouth every 8 (eight) hours. 30 tablet 0 07/16/2020 at Unknown time  . oxyCODONE (OXY IR/ROXICODONE) 5 MG immediate release tablet Take 1 tablet (5 mg total) by mouth every 4 (four) hours as needed for severe pain. 10 tablet 0     Review of Systems  Constitutional: Negative for chills,  diaphoresis, fatigue and fever.  Eyes: Negative for visual disturbance.  Respiratory: Negative for shortness of breath.   Cardiovascular: Positive for chest pain. Negative for palpitations and leg swelling.  Gastrointestinal: Negative for abdominal pain, constipation, diarrhea, nausea and vomiting.  Genitourinary: Negative for dysuria, flank pain, frequency, pelvic pain, urgency, vaginal bleeding and vaginal discharge.  Neurological: Negative for dizziness, weakness, light-headedness and headaches.   Physical Exam   Blood pressure 131/76, pulse 92, temperature 98.3 F (36.8 C), resp. rate 20, height 5\' 2"  (1.575 m), weight 70.8 kg, SpO2 99 %, unknown if currently breastfeeding.  Patient Vitals for the past 24 hrs:  BP Temp Pulse Resp SpO2 Height Weight  07/16/20 1750 131/76 98.3 F (36.8 C) 92 20 99 % 5\' 2"  (1.575 m) 70.8 kg   Physical Exam Vitals and nursing note reviewed.  Constitutional:      General: She is not in acute distress.    Appearance: Normal appearance. She is not ill-appearing, toxic-appearing or diaphoretic.  HENT:     Head: Normocephalic and atraumatic.  Cardiovascular:     Heart sounds: Normal heart sounds.  Pulmonary:     Effort: Pulmonary effort is normal.     Breath sounds: Normal breath sounds.  Chest:    Skin:    General: Skin is warm and dry.  Neurological:     Mental Status: She is alert and oriented to person, place, and time.  Psychiatric:        Mood and Affect: Mood normal.        Behavior: Behavior normal.        Thought Content: Thought content normal.        Judgment: Judgment normal.    Results for orders placed or performed during the hospital encounter of 07/16/20 (from the past 24 hour(s))  CBC     Status: Abnormal   Collection Time: 07/16/20  7:38 PM  Result Value Ref Range   WBC 7.6 4.0 - 10.5 K/uL   RBC 3.48 (L) 3.87 - 5.11 MIL/uL   Hemoglobin 8.9 (L) 12.0 - 15.0 g/dL   HCT 09/13/20 (L) 09/13/20 - 18.8 %   MCV 87.6 80.0 - 100.0 fL    MCH 25.6 (L) 26.0 - 34.0 pg   MCHC 29.2 (L) 30.0 - 36.0 g/dL   RDW 41.6 (H) 60.6 - 30.1 %   Platelets 178 150 - 400 K/uL   nRBC 0.0 0.0 - 0.2 %  Comprehensive metabolic panel     Status: Abnormal   Collection Time: 07/16/20  7:38 PM  Result Value Ref Range   Sodium 139 135 - 145 mmol/L   Potassium 3.6 3.5 - 5.1 mmol/L   Chloride 109 98 -  111 mmol/L   CO2 21 (L) 22 - 32 mmol/L   Glucose, Bld 93 70 - 99 mg/dL   BUN 6 6 - 20 mg/dL   Creatinine, Ser 8.46 0.44 - 1.00 mg/dL   Calcium 8.8 (L) 8.9 - 10.3 mg/dL   Total Protein 6.1 (L) 6.5 - 8.1 g/dL   Albumin 2.9 (L) 3.5 - 5.0 g/dL   AST 27 15 - 41 U/L   ALT 36 0 - 44 U/L   Alkaline Phosphatase 96 38 - 126 U/L   Total Bilirubin 0.2 (L) 0.3 - 1.2 mg/dL   GFR, Estimated >65 >99 mL/min   Anion gap 9 5 - 15   DG Chest 2 View  Result Date: 07/16/2020 CLINICAL DATA:  Status post C-section on July 12, 2020 with right-sided chest pressure. EXAM: CHEST - 2 VIEW COMPARISON:  May 22, 2008 FINDINGS: The heart size and mediastinal contours are within normal limits. Both lungs are clear. The visualized skeletal structures are unremarkable. IMPRESSION: No active cardiopulmonary disease. Electronically Signed   By: Aram Candela M.D.   On: 07/16/2020 19:29    MAU Course  Procedures  MDM -VSS -pain not present at this time -reproducible pain on exam -no s/sx of DVT -lungs clear, heart rrr -low suspicion for PE -consulted with Dr. Crissie Reese who recommends CXR and CBC/CMP for evaluation -CXRay: WNL -CBC: H/H 8.9/30.5 (was 7.5/24.6 2 days ago), platelets 178 -CMP: no abnormalities requiring treatment -pt discharged to home in stable condition  Orders Placed This Encounter  Procedures  . DG Chest 2 View    Standing Status:   Standing    Number of Occurrences:   1    Order Specific Question:   Symptom/Reason for Exam    Answer:   Chest pain [[id number]]    Order Specific Question:   Radiology Contrast Protocol - do NOT remove file path     Answer:   [email or web address]  . CBC    Standing Status:   Standing    Number of Occurrences:   1  . Comprehensive metabolic panel    Standing Status:   Standing    Number of Occurrences:   1  . Discharge patient    Order Specific Question:   Discharge disposition    Answer:   01-Home or Self Care [1]    Order Specific Question:   Discharge patient date    Answer:   07/16/2020   No orders of the defined types were placed in this encounter.  Assessment and Plan   1. Chest pain   2. Postpartum state   3. S/P C-section     Allergies as of 07/16/2020      Reactions   Penicillins Hives   Has patient had a PCN reaction causing immediate rash, facial/tongue/throat swelling, SOB or lightheadedness with hypotension: no Has patient had a PCN reaction causing severe rash involving mucus membranes or skin necrosis: no Has patient had a PCN reaction that required hospitalization: unknown Has patient had a PCN reaction occurring within the last 10 years: no If all of the above answers are "NO", then may proceed with Cephalosporin use.      Medication List    TAKE these medications   acetaminophen 325 MG tablet Commonly known as: TYLENOL Take 2 tablets (650 mg total) by mouth every 6 (six) hours as needed for mild pain.   docusate sodium 100 MG capsule Commonly known as: Colace Take 1 capsule (100 mg total) by mouth 2 (two) times  daily.   ferrous sulfate 325 (65 FE) MG tablet Take 1 tablet (325 mg total) by mouth 3 (three) times daily with meals.   ibuprofen 800 MG tablet Commonly known as: ADVIL Take 1 tablet (800 mg total) by mouth every 8 (eight) hours.   oxyCODONE 5 MG immediate release tablet Commonly known as: Oxy IR/ROXICODONE Take 1 tablet (5 mg total) by mouth every 4 (four) hours as needed for severe pain.       -discussed s/sx of DVT/PE and when to return to MAU -pt discharged to home in stable condition  Elmyra Ricks E  Kano Heckmann 07/16/2020, 8:42 PM

## 2020-07-16 NOTE — MAU Note (Signed)
S/P c-section on 07/12/2020.  Pt c/o chest pressure on the right chest and radiates around her right breast that comes and goes. Hurts to take a deep breath in.  Pain started ealier thi morningNo cough or fever.  Pt is breast feeding.

## 2020-07-16 NOTE — Discharge Instructions (Signed)
Chest Wall Pain Chest wall pain is pain in or around the bones and muscles of your chest. Sometimes, an injury causes this pain. Excessive coughing or overuse of arm and chest muscles may also cause chest wall pain. Sometimes, the cause may not be known. This pain may take several weeks or longer to get better. Follow these instructions at home: Managing pain, stiffness, and swelling   If directed, put ice on the painful area: ? Put ice in a plastic bag. ? Place a towel between your skin and the bag. ? Leave the ice on for 20 minutes, 2-3 times per day. Activity  Rest as told by your health care provider.  Avoid activities that cause pain. These include any activities that use your chest muscles or your abdominal and side muscles to lift heavy items. Ask your health care provider what activities are safe for you. General instructions   Take over-the-counter and prescription medicines only as told by your health care provider.  Do not use any products that contain nicotine or tobacco, such as cigarettes, e-cigarettes, and chewing tobacco. These can delay healing after injury. If you need help quitting, ask your health care provider.  Keep all follow-up visits as told by your health care provider. This is important. Contact a health care provider if:  You have a fever.  Your chest pain becomes worse.  You have new symptoms. Get help right away if:  You have nausea or vomiting.  You feel sweaty or light-headed.  You have a cough with mucus from your lungs (sputum) or you cough up blood.  You develop shortness of breath. These symptoms may represent a serious problem that is an emergency. Do not wait to see if the symptoms will go away. Get medical help right away. Call your local emergency services (911 in the [place] Do not drive yourself to the hospital. Summary  Chest wall pain is pain in or around the bones and muscles of your chest.  Depending on the cause, it may be  treated with ice, rest, medicines, and avoiding activities that cause pain.  Contact a health care provider if you have a fever, worsening chest pain, or new symptoms.  Get help right away if you feel light-headed or you develop shortness of breath. These symptoms may be an emergency. This information is not intended to replace advice given to you by your health care provider. Make sure you discuss any questions you have with your health care provider. Document Revised: 12/28/2017 Document Reviewed: 12/28/2017 Elsevier Patient Education  2020 Else[employer] Inc[employer]         Nonspecific Chest Pain, Adult Chest pain can be caused by many different conditions. It can be caused by a condition that is life-threatening and requires treatment right away. It can also be caused by something that is not life-threatening. If you have chest pain, it can be hard to know the difference, so it is important to get help right away to make sure that you do not have a serious condition. Some life-threatening causes of chest pain include:  Heart attack.  A tear in the body's main blood vessel (aortic dissection).  Inflammation around your heart (pericarditis).  A problem in the lungs, such as a blood clot (pulmonary embolism) or a collapsed lung (pneumothorax). Some non life-threatening causes of chest pain include:  Heartburn.  Anxiety or stress.  Damage to the bones, muscles, and cartilage that make up your chest wall.  Pneumonia or bronchitis.  Shingles infection (varicella-zoster  virus). Chest pain can feel like:  Pain or discomfort on the surface of your chest or deep in your chest.  Crushing, pressure, aching, or squeezing pain.  Burning or tingling.  Dull or sharp pain that is worse when you move, cough, or take a deep breath.  Pain or discomfort that is also felt in your back, neck, jaw, shoulder, or arm, or pain that spreads to any of these areas. Your chest pain may come and go. It may  also be constant. Your health care provider will do lab tests and other studies to find the cause of your pain. Treatment will depend on the cause of your chest pain. Follow these instructions at home: Medicines  Take over-the-counter and prescription medicines only as told by your health care provider.  If you were prescribed an antibiotic, take it as told by your health care provider. Do not stop taking the antibiotic even if you start to feel better. Lifestyle   Rest as directed by your health care provider.  Do not use any products that contain nicotine or tobacco, such as cigarettes and e-cigarettes. If you need help quitting, ask your health care provider.  Do not drink alcohol.  Make healthy lifestyle choices as recommended. These may include: ? Getting regular exercise. Ask your health care provider to suggest some activities that are safe for you. ? Eating a heart-healthy diet. This includes plenty of fresh fruits and vegetables, whole grains, low-fat (lean) protein, and low-fat dairy products. A dietitian can help you find healthy eating options. ? Maintaining a healthy weight. ? Managing any other health conditions you have, such as high blood pressure (hypertension) or diabetes. ? Reducing stress, such as with yoga or relaxation techniques. General instructions  Pay attention to any changes in your symptoms. Tell your health care provider about them or any new symptoms.  Avoid any activities that cause chest pain.  Keep all follow-up visits as told by your health care provider. This is important. This includes visits for any further testing if your chest pain does not go away. Contact a health care provider if:  Your chest pain does not go away.  You feel depressed.  You have a fever. Get help right away if:  Your chest pain gets worse.  You have a cough that gets worse, or you cough up blood.  You have severe pain in your abdomen.  You faint.  You have  sudden, unexplained chest discomfort.  You have sudden, unexplained discomfort in your arms, back, neck, or jaw.  You have shortness of breath at any time.  You suddenly start to sweat, or your skin gets clammy.  You feel nausea or you vomit.  You suddenly feel lightheaded or dizzy.  You have severe weakness, or unexplained weakness or fatigue.  Your heart begins to beat quickly, or it feels like it is skipping beats. These symptoms may represent a serious problem that is an emergency. Do not wait to see if the symptoms will go away. Get medical help right away. Call your local emergency services (911 in the [place] Do not drive yourself to the hospital. Summary  Chest pain can be caused by a condition that is serious and requires urgent treatment. It may also be caused by something that is not life-threatening.  If you have chest pain, it is very important to see your health care provider. Your health care provider may do lab tests and other studies to find the cause of your pain.  Follow your health care provider's instructions on taking medicines, making lifestyle changes, and getting emergency treatment if symptoms become worse.  Keep all follow-up visits as told by your health care provider. This includes visits for any further testing if your chest pain does not go away. This information is not intended to replace advice given to you by your health care provider. Make sure you discuss any questions you have with your health care provider. Document Revised: 12/28/2017 Document Reviewed: 12/28/2017 Elsevier Patient Education  2020 Elsevier Inc.        Pulmonary Embolism  A pulmonary embolism (PE) is a sudden blockage or decrease of blood flow in one or both lungs. Most blockages come from a blood clot that forms in the vein of a lower leg, thigh, or arm (deep vein thrombosis, DVT) and travels to the lungs. A clot is blood that has thickened into a gel or solid. PE is a  dangerous and life-threatening condition that needs to be treated right away. What are the causes? This condition is usually caused by a blood clot that forms in a vein and moves to the lungs. In rare cases, it may be caused by air, fat, part of a tumor, or other tissue that moves through the veins and into the lungs. What increases the risk? The following factors may make you more likely to develop this condition:  Experiencing a traumatic injury, such as breaking a hip or leg.  Having: ? A spinal cord injury. ? Orthopedic surgery, especially hip or knee replacement. ? Any major surgery. ? A stroke. ? DVT. ? Blood clots or blood clotting disease. ? Long-term (chronic) lung or heart disease. ? Cancer treated with chemotherapy. ? A central venous catheter.  Taking medicines that contain estrogen. These include birth control pills and hormone replacement therapy.  Being: ? Pregnant. ? In the period of time after your baby is delivered (postpartum). ? Older than age 94. ? Overweight. ? A smoker, especially if you have other risks. What are the signs or symptoms? Symptoms of this condition usually start suddenly and include:  Shortness of breath during activity or at rest.  Coughing, coughing up blood, or coughing up blood-tinged mucus.  Chest pain that is often worse with deep breaths.  Rapid or irregular heartbeat.  Feeling light-headed or dizzy.  Fainting.  Feeling anxious.  Fever.  Sweating.  Pain and swelling in a leg. This is a symptom of DVT, which can lead to PE. How is this diagnosed? This condition may be diagnosed based on:  Your medical history.  A physical exam.  Blood tests.  CT pulmonary angiogram. This test checks blood flow in and around your lungs.  Ventilation-perfusion scan, also called a lung VQ scan. This test measures air flow and blood flow to the lungs.  An ultrasound of the legs. How is this treated? Treatment for this condition  depends on many factors, such as the cause of your PE, your risk for bleeding or developing more clots, and other medical conditions you have. Treatment aims to remove, dissolve, or stop blood clots from forming or growing larger. Treatment may include:  Medicines, such as: ? Blood thinning medicines (anticoagulants) to stop clots from forming. ? Medicines that dissolve clots (thrombolytics).  Procedures, such as: ? Using a flexible tube to remove a blood clot (embolectomy) or to deliver medicine to destroy it (catheter-directed thrombolysis). ? Inserting a filter into a large vein that carries blood to the heart (inferior vena cava). This  filter (vena cava filter) catches blood clots before they reach the lungs. ? Surgery to remove the clot (surgical embolectomy). This is rare. You may need a combination of immediate, long-term (up to 3 months after diagnosis), and extended (more than 3 months after diagnosis) treatments. Your treatment may continue for several months (maintenance therapy). You and your health care provider will work together to choose the treatment program that is best for you. Follow these instructions at home: Medicines  Take over-the-counter and prescription medicines only as told by your health care provider.  If you are taking an anticoagulant medicine: ? Take the medicine every day at the same time each day. ? Understand what foods and drugs interact with your medicine. ? Understand the side effects of this medicine, including excessive bruising or bleeding. Ask your health care provider or pharmacist about other side effects. General instructions  Wear a medical alert bracelet or carry a medical alert card that says you have had a PE and lists what medicines you take.  Ask your health care provider when you may return to your normal activities. Avoid sitting or lying for a long time without moving.  Maintain a healthy weight. Ask your health care provider what  weight is healthy for you.  Do not use any products that contain nicotine or tobacco, such as cigarettes, e-cigarettes, and chewing tobacco. If you need help quitting, ask your health care provider.  Talk with your health care provider about any travel plans. It is important to make sure that you are still able to take your medicine while on trips.  Keep all follow-up visits as told by your health care provider. This is important. Contact a health care provider if:  You missed a dose of your blood thinner medicine. Get help right away if:  You have: ? New or increased pain, swelling, warmth, or redness in an arm or leg. ? Numbness or tingling in an arm or leg. ? Shortness of breath during activity or at rest. ? A fever. ? Chest pain. ? A rapid or irregular heartbeat. ? A severe headache. ? Vision changes. ? A serious fall or accident, or you hit your head. ? Stomach (abdominal) pain. ? Blood in your vomit, stool, or urine. ? A cut that will not stop bleeding.  You cough up blood.  You feel light-headed or dizzy.  You cannot move your arms or legs.  You are confused or have memory loss. These symptoms may represent a serious problem that is an emergency. Do not wait to see if the symptoms will go away. Get medical help right away. Call your local emergency services (911 in the [place] Do not drive yourself to the hospital. Summary  A pulmonary embolism (PE) is a sudden blockage or decrease of blood flow in one or both lungs. PE is a dangerous and life-threatening condition that needs to be treated right away.  Treatments for this condition usually include medicines to thin your blood (anticoagulants) or medicines to break apart blood clots (thrombolytics).  If you are given blood thinners, it is important to take the medicine every day at the same time each day.  Understand what foods and drugs interact with any medicines that you are taking.  If you have signs of PE or DVT,  call your local emergency services (911 in the [place] This information is not intended to replace advice given to you by your health care provider. Make sure you discuss any questions you have with your  health care provider. Document Revised: 04/04/2018 Document Reviewed: 04/04/2018 Elsevier Patient Education  2020 [employer].

## 2021-02-22 DIAGNOSIS — R1031 Right lower quadrant pain: Secondary | ICD-10-CM | POA: Diagnosis not present

## 2021-02-22 DIAGNOSIS — R102 Pelvic and perineal pain: Secondary | ICD-10-CM | POA: Insufficient documentation

## 2021-02-23 ENCOUNTER — Emergency Department (HOSPITAL_COMMUNITY): Payer: Medicaid Other

## 2021-02-23 ENCOUNTER — Emergency Department (HOSPITAL_COMMUNITY)
Admission: EM | Admit: 2021-02-23 | Discharge: 2021-02-23 | Disposition: A | Discharge status: Home / Self Care | Payer: Medicaid Other | Attending: Emergency Medicine | Admitting: Emergency Medicine

## 2021-02-23 ENCOUNTER — Other Ambulatory Visit: Payer: Self-pay

## 2021-02-23 ENCOUNTER — Encounter (HOSPITAL_COMMUNITY): Payer: Self-pay | Admitting: Emergency Medicine

## 2021-02-23 DIAGNOSIS — R1031 Right lower quadrant pain: Secondary | ICD-10-CM

## 2021-02-23 DIAGNOSIS — R102 Pelvic and perineal pain: Secondary | ICD-10-CM

## 2021-02-23 LAB — CBC WITH DIFFERENTIAL/PLATELET
Abs Immature Granulocytes: 0.06 10*3/uL (ref 0.00–0.07)
Basophils Absolute: 0 10*3/uL (ref 0.0–0.1)
Basophils Relative: 0 %
Eosinophils Absolute: 0 10*3/uL (ref 0.0–0.5)
Eosinophils Relative: 0 %
HCT: 35.7 % — ABNORMAL LOW (ref 36.0–46.0)
Hemoglobin: 12.5 g/dL (ref 12.0–15.0)
Immature Granulocytes: 0 %
Lymphocytes Relative: 8 %
Lymphs Abs: 1.1 10*3/uL (ref 0.7–4.0)
MCH: 29.8 pg (ref 26.0–34.0)
MCHC: 35 g/dL (ref 30.0–36.0)
MCV: 85.2 fL (ref 80.0–100.0)
Monocytes Absolute: 1 10*3/uL (ref 0.1–1.0)
Monocytes Relative: 7 %
Neutro Abs: 11.2 10*3/uL — ABNORMAL HIGH (ref 1.7–7.7)
Neutrophils Relative %: 85 %
Platelets: 211 10*3/uL (ref 150–400)
RBC: 4.19 MIL/uL (ref 3.87–5.11)
RDW: 12.4 % (ref 11.5–15.5)
WBC: 13.4 10*3/uL — ABNORMAL HIGH (ref 4.0–10.5)
nRBC: 0 % (ref 0.0–0.2)

## 2021-02-23 LAB — COMPREHENSIVE METABOLIC PANEL
ALT: 17 U/L (ref 0–44)
AST: 16 U/L (ref 15–41)
Albumin: 4.1 g/dL (ref 3.5–5.0)
Alkaline Phosphatase: 62 U/L (ref 38–126)
Anion gap: 11 (ref 5–15)
BUN: 6 mg/dL (ref 6–20)
CO2: 19 mmol/L — ABNORMAL LOW (ref 22–32)
Calcium: 9.2 mg/dL (ref 8.9–10.3)
Chloride: 104 mmol/L (ref 98–111)
Creatinine, Ser: 0.68 mg/dL (ref 0.44–1.00)
GFR, Estimated: >60 mL/min (ref >60)
Glucose, Bld: 107 mg/dL — ABNORMAL HIGH (ref 70–99)
Potassium: 3.7 mmol/L (ref 3.5–5.1)
Sodium: 134 mmol/L — ABNORMAL LOW (ref 135–145)
Total Bilirubin: 0.6 mg/dL (ref 0.3–1.2)
Total Protein: 7.1 g/dL (ref 6.5–8.1)

## 2021-02-23 LAB — URINALYSIS, ROUTINE W REFLEX MICROSCOPIC
Bacteria, UA: NONE SEEN
Bilirubin Urine: NEGATIVE
Glucose, UA: NEGATIVE mg/dL
Hgb urine dipstick: NEGATIVE
Ketones, ur: NEGATIVE mg/dL
Nitrite: NEGATIVE
Protein, ur: NEGATIVE mg/dL
Specific Gravity, Urine: 1.014 (ref 1.005–1.030)
pH: 8 (ref 5.0–8.0)

## 2021-02-23 LAB — I-STAT BETA HCG BLOOD, ED (MC, WL, AP ONLY): I-stat hCG, quantitative: <5 m[IU]/mL (ref <5)

## 2021-02-23 MED ORDER — ONDANSETRON HCL 4 MG/2ML IJ SOLN
4.0000 mg | Freq: Once | INTRAMUSCULAR | Status: DC
  Filled 2021-02-23: qty 2

## 2021-02-23 MED ORDER — HYDROMORPHONE HCL 1 MG/ML IJ SOLN
1.0000 mg | Freq: Once | INTRAMUSCULAR | Status: AC
  Administered 2021-02-23: 1 mg via INTRAVENOUS
  Filled 2021-02-23: qty 1

## 2021-02-23 MED ORDER — FENTANYL CITRATE (PF) 100 MCG/2ML IJ SOLN
INTRAMUSCULAR | Status: AC
  Administered 2021-02-23: 75 ug via INTRAVENOUS
  Filled 2021-02-23: qty 2

## 2021-02-23 MED ORDER — IOHEXOL 350 MG/ML SOLN
80.0000 mL | Freq: Once | INTRAVENOUS | Status: AC | PRN
  Administered 2021-02-23: 80 mL via INTRAVENOUS

## 2021-02-23 MED ORDER — FENTANYL CITRATE (PF) 100 MCG/2ML IJ SOLN: 75.0000 ug | Freq: Once | INTRAMUSCULAR | Status: AC

## 2021-02-23 NOTE — ED Provider Notes (Signed)
[facility] EMERGENCY DEPARTMENT Provider Note   CSN: [id number] Arrival date & time: 02/22/21  2358     History Chief Complaint  Patient presents with   Abdominal Pain    Christine Osborn is a 25 y.o. female.  The history is provided by the patient and medical records.  Abdominal Pain  25 y.o. F with hx of depression, presenting to the ED for RLQ abdominal pain.  States it started this morning, mostly with just some nausea and overall feeling poorly.  Pain has been worsening throughout the day, now making it hard for her to stand up or lay straight.  She reports no appetite today, no vomiting or diarrhea.  She did urinate on arrival to ED which she states worsened the pain in her abdomen.  Denies fever.  Has had prior c-section x2.  Given fentanyl in triage without relief.  Past Medical History:  Diagnosis Date   Depression    Morning sickness 03/14/2017    Patient Active Problem List   Diagnosis Date Noted   Post-operative state 07/12/2020   Postoperative state 09/15/2017   Morning sickness 03/14/2017    Past Surgical History:  Procedure Laterality Date   CESAREAN SECTION N/A 09/15/2017   Procedure: CESAREAN SECTION;  Surgeon: Carrington Clamp, MD;  Location: [facility] SUITES;  Service: Obstetrics;  Laterality: N/A;   CESAREAN SECTION N/A 07/12/2020   Procedure: CESAREAN SECTION;  Surgeon: Carrington Clamp, MD;  Location: [facility] LD ORS;  Service: Obstetrics;  Laterality: N/A;   OTHER SURGICAL HISTORY     Pt required second spinal epidural     OB History     Gravida  2   Para  2   Term  2   Preterm      AB      Living  2      SAB      IAB      Ectopic      Multiple  0   Live Births  2           Family History  Problem Relation Age of Onset   Hypertension Mother    Stroke Mother    Hypertension Father     Social History   Tobacco Use   Smoking status: Never   Smokeless tobacco: Never  Vaping Use   Vaping Use: Never  used  Substance Use Topics   Alcohol use: No   Drug use: No    Home Medications Prior to Admission medications   Medication Sig Start Date End Date Taking? Authorizing Provider  acetaminophen (TYLENOL) 325 MG tablet Take 2 tablets (650 mg total) by mouth every 6 (six) hours as needed for mild pain. 07/14/20   Charlett Nose, MD  docusate sodium (COLACE) 100 MG capsule Take 1 capsule (100 mg total) by mouth 2 (two) times daily. 07/14/20   Charlett Nose, MD  ferrous sulfate 325 (65 FE) MG tablet Take 1 tablet (325 mg total) by mouth 3 (three) times daily with meals. 07/14/20   Charlett Nose, MD  ibuprofen (ADVIL) 800 MG tablet Take 1 tablet (800 mg total) by mouth every 8 (eight) hours. 07/14/20   Charlett Nose, MD  oxyCODONE (OXY IR/ROXICODONE) 5 MG immediate release tablet Take 1 tablet (5 mg total) by mouth every 4 (four) hours as needed for severe pain. 07/14/20   Charlett Nose, MD    Allergies    Penicillins  Review of Systems   Review  of Systems  Gastrointestinal:  Positive for abdominal pain.  All other systems reviewed and are negative.  Physical Exam Updated Vital Signs BP 122/70 (BP Location: Right Arm)   Pulse (!) 121   Temp 98.9 F (37.2 C)   Resp (!) 24   SpO2 100%   Physical Exam Vitals and nursing note reviewed.  Constitutional:      Appearance: She is well-developed.     Comments: Appears uncomfortable, hyperventilating  HENT:     Head: Normocephalic and atraumatic.  Eyes:     Conjunctiva/sclera: Conjunctivae normal.     Pupils: Pupils are equal, round, and reactive to light.  Cardiovascular:     Rate and Rhythm: Normal rate and regular rhythm.     Heart sounds: Normal heart sounds.  Pulmonary:     Effort: Pulmonary effort is normal.     Breath sounds: Normal breath sounds.  Abdominal:     General: Bowel sounds are normal.     Palpations: Abdomen is soft.     Tenderness: There is abdominal tenderness in the right lower  quadrant.     Comments: Curled up into fetal position, voluntary guarding RLQ  Musculoskeletal:        General: Normal range of motion.     Cervical back: Normal range of motion.  Skin:    General: Skin is warm and dry.  Neurological:     Mental Status: She is alert and oriented to person, place, and time.    ED Results / Procedures / Treatments   Labs (all labs ordered are listed, but only abnormal results are displayed) Labs Reviewed  CBC WITH DIFFERENTIAL/PLATELET - Abnormal; Notable for the following components:      Result Value   WBC 13.4 (*)    HCT 35.7 (*)    Neutro Abs 11.2 (*)    All other components within normal limits  COMPREHENSIVE METABOLIC PANEL - Abnormal; Notable for the following components:   Sodium 134 (*)    CO2 19 (*)    Glucose, Bld 107 (*)    All other components within normal limits  URINALYSIS, ROUTINE W REFLEX MICROSCOPIC - Abnormal; Notable for the following components:   APPearance HAZY (*)    Leukocytes,Ua SMALL (*)    All other components within normal limits  I-STAT BETA HCG BLOOD, ED ([facility], [facility], [facility] ONLY)    EKG None  Radiology CT ABDOMEN PELVIS W CONTRAST  Result Date: 02/23/2021 CLINICAL DATA:  Right lower quadrant abdominal pain EXAM: CT ABDOMEN AND PELVIS WITH CONTRAST TECHNIQUE: Multidetector CT imaging of the abdomen and pelvis was performed using the standard protocol following bolus administration of intravenous contrast. CONTRAST:  67mL OMNIPAQUE IOHEXOL 350 MG/ML SOLN COMPARISON:  None. FINDINGS: Lower chest: Lung bases are clear. Hepatobiliary: Liver is within normal limits. Gallbladder is unremarkable. No intrahepatic or extrahepatic ductal dilatation. Pancreas: Within normal limits. Spleen: Within normal limits. Adrenals/Urinary Tract: Adrenal glands are within normal limits. Kidneys are within normal limits.  No hydronephrosis. Bladder is within normal limits. Stomach/Bowel: Stomach is within normal limits. No evidence of bowel  obstruction. Normal appendix (series 3/image 57). No colonic wall thickening or inflammatory changes. Vascular/Lymphatic: No evidence of abdominal aortic aneurysm. No suspicious abdominopelvic lymphadenopathy. Reproductive: Uterus is within normal limits. Bilateral ovaries are within normal limits, noting a right corpus luteum. Bilateral tubal ligation clips. Other: No abdominopelvic ascites. Musculoskeletal: Visualized osseous structures are within normal limits. IMPRESSION: Negative CT abdomen/pelvis.  Normal appendix. Electronically Signed   By: Lurlean Horns  Rito Ehrlich M.D.   On: 02/23/2021 01:46   US PELVIC COMPLETE W TRANSVAGINAL AND TORSION R/O  Result Date: 02/23/2021 CLINICAL DATA:  Pelvic pain EXAM: TRANSABDOMINAL AND TRANSVAGINAL ULTRASOUND OF PELVIS DOPPLER ULTRASOUND OF OVARIES TECHNIQUE: Both transabdominal and transvaginal ultrasound examinations of the pelvis were performed. Transabdominal technique was performed for global imaging of the pelvis including uterus, ovaries, adnexal regions, and pelvic cul-de-sac. It was necessary to proceed with endovaginal exam following the transabdominal exam to visualize the right ovary and left adnexa. Color and duplex Doppler ultrasound was utilized to evaluate blood flow to the ovaries. COMPARISON:  CT abdomen/pelvis dated 02/23/2021 FINDINGS: Uterus Measurements: 8.1 x 3.9 x 4.9 cm = volume: 81 mL. No fibroids or other mass visualized. Endometrium Thickness: 18 mm.  No focal abnormality visualized. Right ovary Measurements: 3.2 x 2.0 x 2.4 cm = volume: 8.2 mL. Normal appearance/no adnexal mass, noting a 2.2 cm corpus luteum, physiologic. Left ovary Not discretely visualized due to adjacent bowel loops. No adnexal mass. Pulsed Doppler evaluation of the right ovary demonstrates normal low-resistance arterial and venous waveforms. Other findings No abnormal free fluid. IMPRESSION: Left ovary is not discretely visualized. Uterus and right ovary are within normal  limits. No evidence of right ovarian torsion. Electronically Signed   By: Charline Bills M.D.   On: 02/23/2021 03:21    Procedures Procedures   Medications Ordered in ED Medications  ondansetron (ZOFRAN) injection 4 mg (has no administration in time range)  HYDROmorphone (DILAUDID) injection 1 mg (has no administration in time range)  fentaNYL (SUBLIMAZE) injection 75 mcg (75 mcg Intravenous Given 02/23/21 0026)    ED Course  I have reviewed the triage vital signs and the nursing notes.  Pertinent labs & imaging results that were available during my care of the patient were reviewed by me and considered in my medical decision making (see chart for details).    MDM Rules/Calculators/A&P                           25 y.o. F here with RLQ abdominal pain.  States it started this morning, worsening throughout the day.  She is afebrile, non-toxic but does appear uncomfortable.  TTP in RLQ with voluntary guarding.  Labs and CT ordered from triage and are pending at this time.  2:07 AM Patient reports continued pain.  States now she feels it more in her pelvis near her c-section scar.  CT without acute findings.  Will obtain transvaginal US with doppler.  4:02 AM [place] is negative.  Pain controlled after dose of IV dilaudid.   She did mention a fall last evening to RN while in [place] and reports pain started afterwards and feels it into right hip-- she did not mention this to me at any point during H&P.  No acute bony findings about the hip seen on CT.  Unclear etiology of her pain but as there does not seem to be a surgical explanation, feel she is stable for discharge.  Have recommended that she continue symptomatic care and follow-up with PCP.  Return here for new concerns.  Final Clinical Impression(s) / ED Diagnoses Final diagnoses:  Pelvic pain  Right lower quadrant abdominal pain    Rx / DC Orders ED Discharge Orders     None        Garlon Hatchet, PA-C 02/23/21 0442     Shon Baton, MD 02/23/21 [phone]

## 2021-02-23 NOTE — ED Notes (Signed)
Pt reports syncope ground level fall last night, striking back of head on wood floor. Pt reports she went to bed after this and woke with right leg stiffness and difficulty walking and an increase in pain in her right lower quadrant.

## 2021-02-23 NOTE — ED Triage Notes (Signed)
Pt c/o lower right abd pn that goes to her back.  Stated today and has gotten worse w rebound tenderness. Denies N/V/D  138/90 - 206/184 Given 20mg  Ketamine (no change) HR 120 -130  100% RA

## 2021-02-23 NOTE — ED Provider Notes (Signed)
Emergency Medicine Provider Triage Evaluation Note  Christine Osborn , a 25 y.o. female  was evaluated in triage.  Pt complains of right lower quadrant abdominal pain that began today.  She has had a poor appetite and has been nauseated today.  Pain has become so severe that she cannot walk.  No fever or chills.  She has been voiding less than usual today, despite drinking several bottles of water.  She has a history of a C-section and a tubal ligation.  No improvement in pain in route with EMS after she was given ketamine.  Blood pressure noted to be 206/184 and heart rate in the 120s to 130s with EMS.  Review of Systems  Positive: Abdominal pain, nausea, anorexia Negative: Fever, chills, chest pain, shortness of breath, rash  Physical Exam  There were no vitals taken for this visit. Gen:   Awake, she is in distress Resp:  Hyperventilating, and grunting MSK:   Moves extremities without difficulty  Other:  Abdomen is mildly distended.  There is guarding.  Generally tender to palpation throughout the abdomen, but there is a focal tenderness to palpation in the right lower quadrant.  She is tender to palpation over McBurney's point.  Medical Decision Making  Medically screening exam initiated at 12:11 AM.  Appropriate orders placed.  Christine Osborn was informed that the remainder of the evaluation will be completed by another provider, this initial triage assessment does not replace that evaluation, and the importance of remaining in the ED until their evaluation is complete.  25 year old female who presents the emergency department with abdominal pain, anorexia, nausea.  Pain is increased to the point where he is having difficulty walking.  On exam, she is in acute distress, hyperventilating, and grunting.  She was markedly hypertensive in route with EMS to 206/184.  Tachycardic in the 120s to 130s.  There was no improvement in her symptoms with ketamine.  Will attempt to control her pain with  fentanyl and Zofran in triage.  However, if her pain is unable to be controlled, she will need to be evaluated as her abdomen is concerning for a surgical abdomen.  Charge nurse made aware.   Christine Osborn A, PA-C 02/23/21 0015    Christine Octave, MD 02/23/21 [phone]

## 2021-02-23 NOTE — Discharge Instructions (Addendum)
Your imaging today was normal, no acute findings. Recommend to continue tylenol or motrin as needed for pain. Follow-up with your primary care doctor. Return here for new concerns.

## 2022-01-05 IMAGING — CT CT ABD-PELV W/ CM
2 of 4 series · 17 of 46 positions shown, 19 images · IV contrast (Omni 300)
Comparison: None.

CLINICAL DATA: Right lower quadrant abdominal pain

EXAM:
CT ABDOMEN AND PELVIS WITH CONTRAST
TECHNIQUE: Multidetector CT imaging of the abdomen and pelvis was performed
using the standard protocol following bolus administration of
intravenous contrast.
CONTRAST:  80mL OMNIPAQUE IOHEXOL 350 MG/ML SOLN

[Series 3: a/p w/ 5mm · axial · 0.89mm/px · z∈[+102,+537]mm · 14 of 95 slices shown, 16 images]
[im 4/95  soft-tissue]
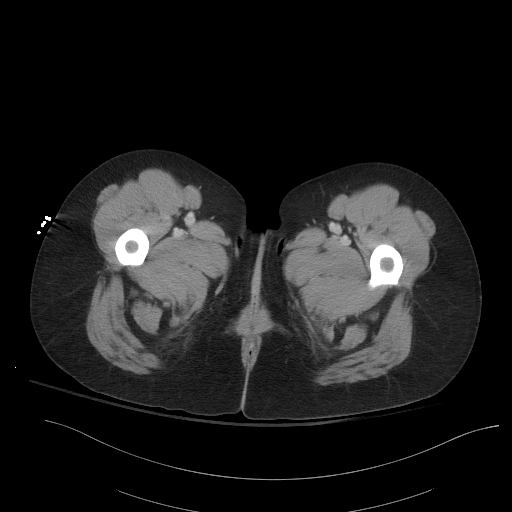
[im 4/95  bone]
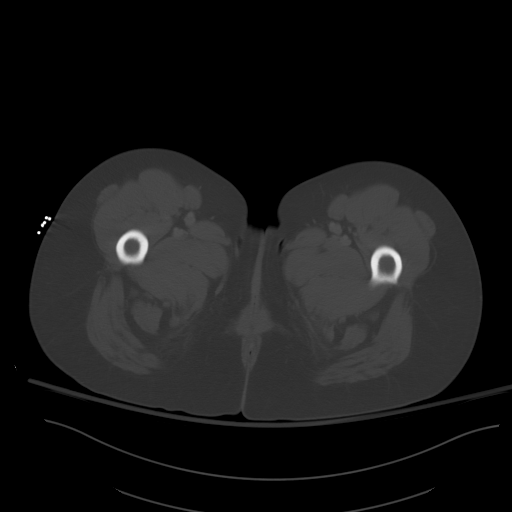
[im 12/95  soft-tissue]
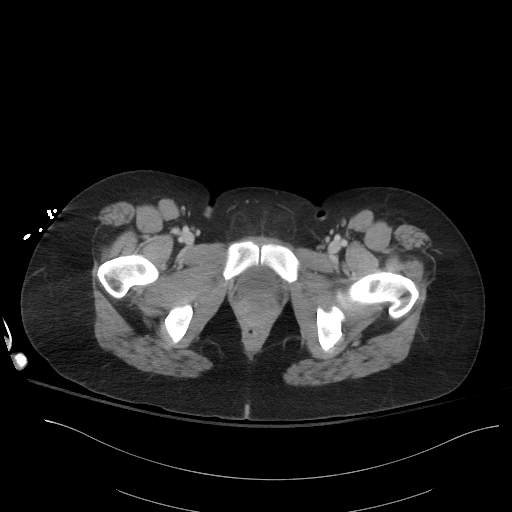
[im 19/95  soft-tissue]
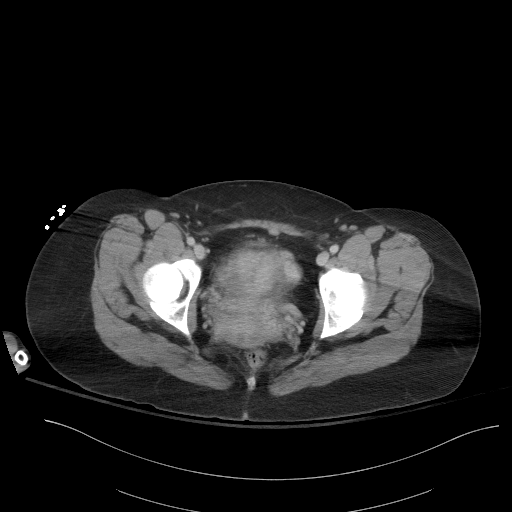
[im 27/95  soft-tissue]
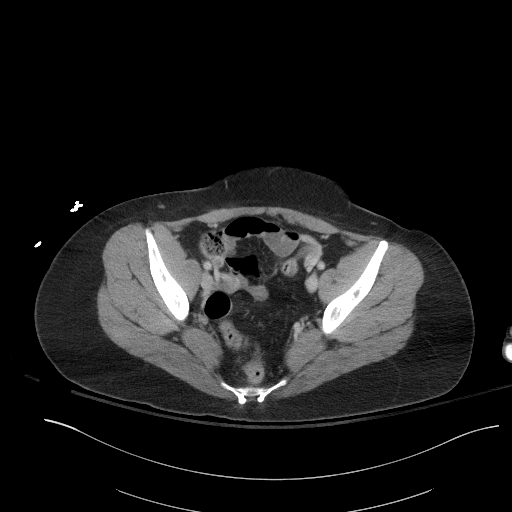
[im 31/95  soft-tissue]
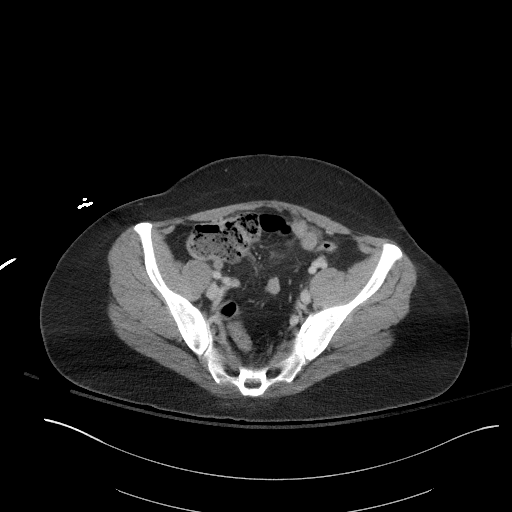
[im 38/95  soft-tissue]
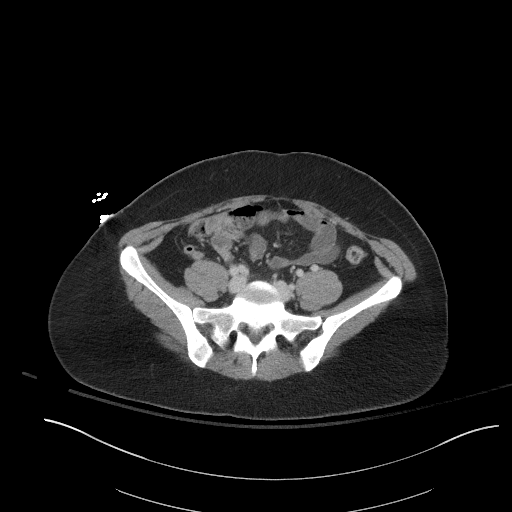
[im 46/95  soft-tissue]
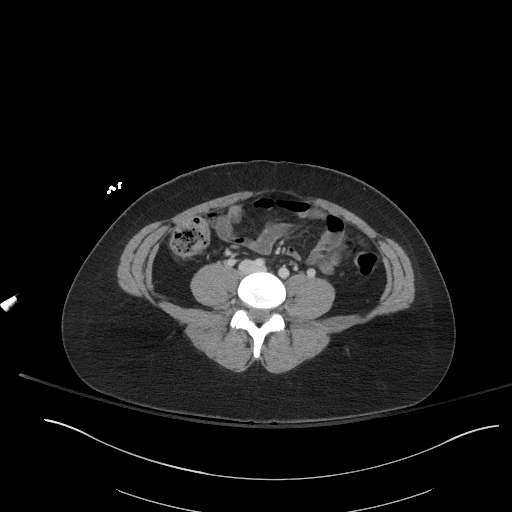
[im 49/95  soft-tissue]
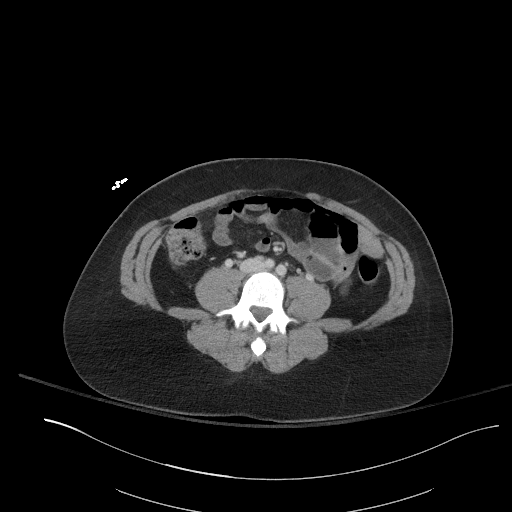
[im 57/95  soft-tissue]
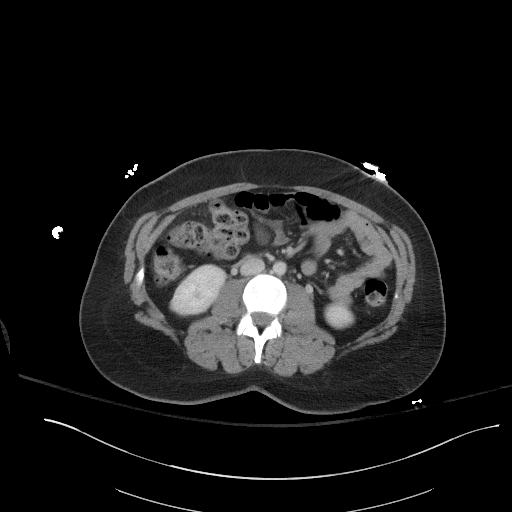
[im 57/95  bone]
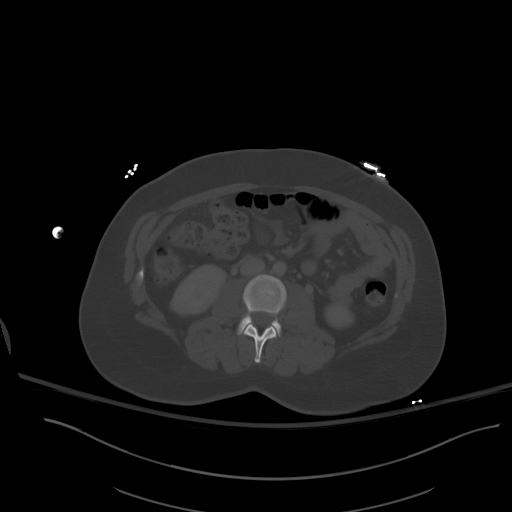
[im 64/95  soft-tissue]
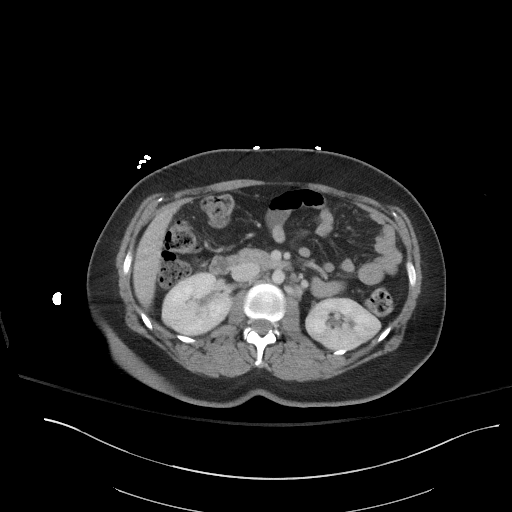
[im 72/95  soft-tissue]
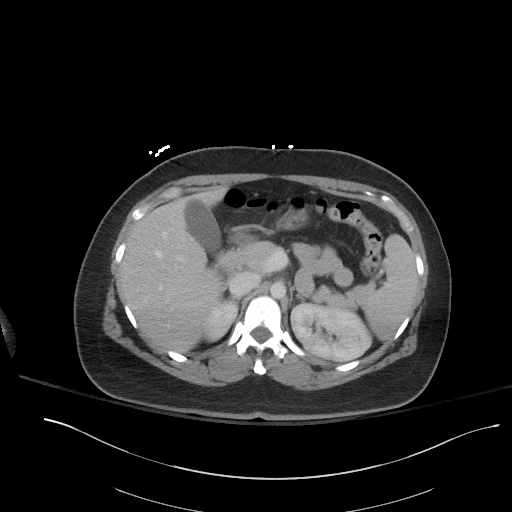
[im 76/95  soft-tissue]
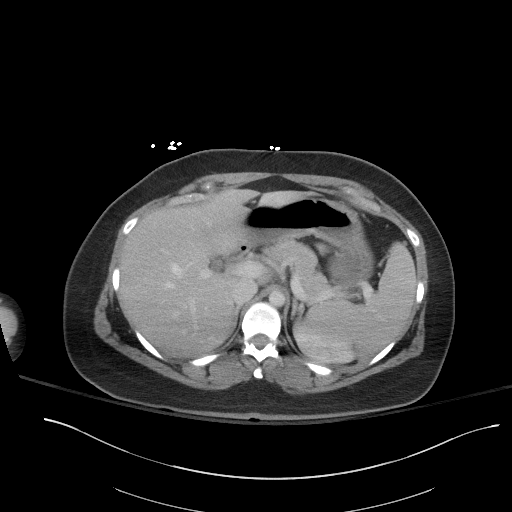
[im 83/95  soft-tissue]
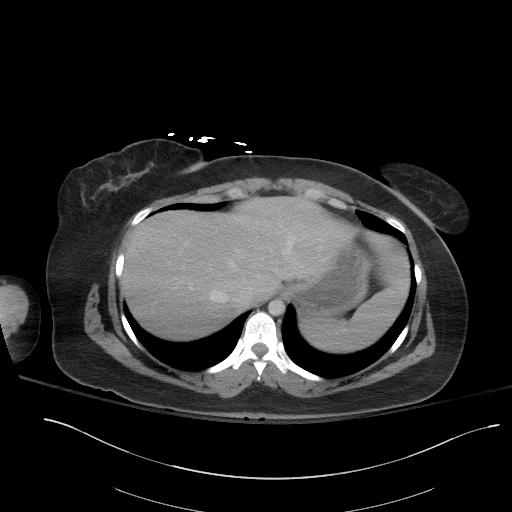
[im 91/95  soft-tissue]
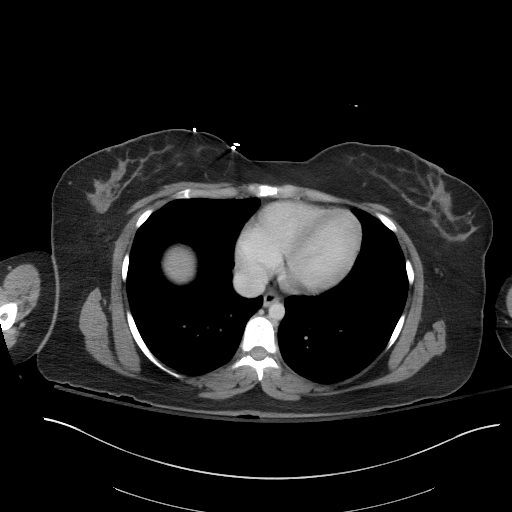

[Series 6: a/p w/ cor · coronal · 0.85mm/px · 3 of 112 slices shown]
[im 38/112  soft-tissue]
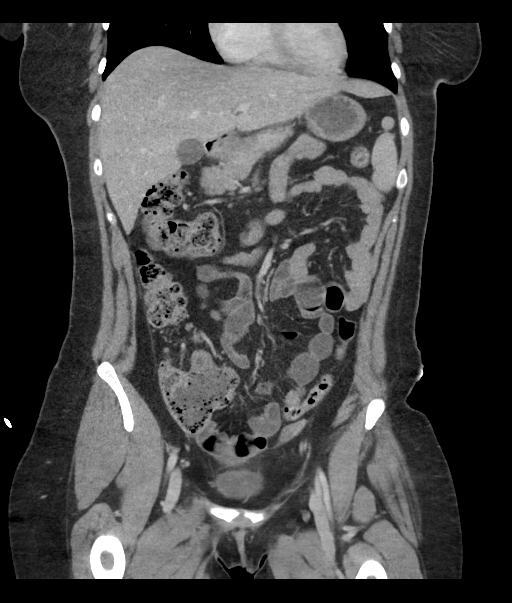
[im 50/112  soft-tissue]
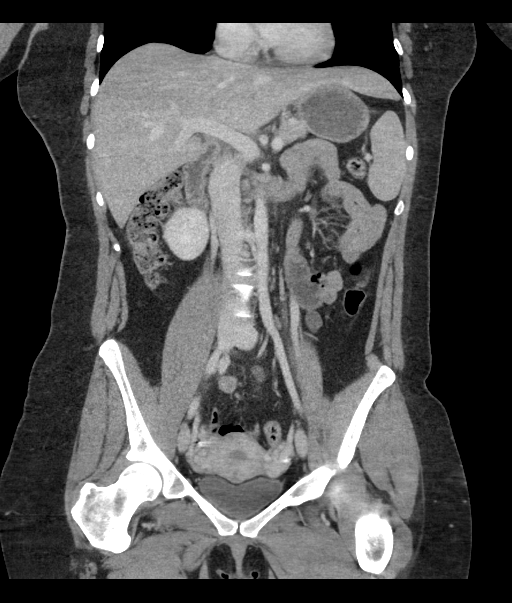
[im 62/112  soft-tissue]
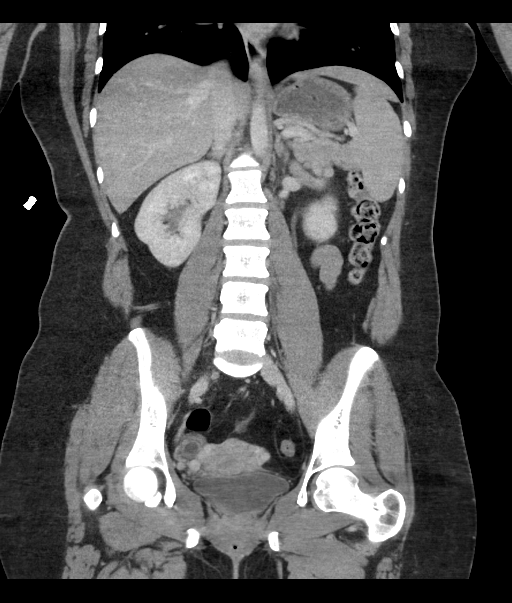

[17 of 46 positions shown; findings below may reference images not displayed]

FINDINGS: Lower chest: Lung bases are clear.

Hepatobiliary: Liver is within normal limits.

Gallbladder is unremarkable. No intrahepatic or extrahepatic ductal
dilatation.

Pancreas: Within normal limits.

Spleen: Within normal limits.

Adrenals/Urinary Tract: Adrenal glands are within normal limits.

Kidneys are within normal limits.  No hydronephrosis.

Bladder is within normal limits.

Stomach/Bowel: Stomach is within normal limits.

No evidence of bowel obstruction.

Normal appendix (series 3/image 57).

No colonic wall thickening or inflammatory changes.

Vascular/Lymphatic: No evidence of abdominal aortic aneurysm.

No suspicious abdominopelvic lymphadenopathy.

Reproductive: Uterus is within normal limits.

Bilateral ovaries are within normal limits, noting a right corpus
luteum. Bilateral tubal ligation clips.

Other: No abdominopelvic ascites.

Musculoskeletal: Visualized osseous structures are within normal
limits.
IMPRESSION: Negative CT abdomen/pelvis.  Normal appendix.
